# Patient Record
Sex: Female | Born: 1950
Health system: Southern US, Community
[De-identification: ages and names within clinical notes are randomized; demographics above are authoritative.]

## PROBLEM LIST (undated history)

## (undated) DIAGNOSIS — K219 Gastro-esophageal reflux disease without esophagitis: Secondary | ICD-10-CM

## (undated) DIAGNOSIS — I10 Essential (primary) hypertension: Secondary | ICD-10-CM

## (undated) DIAGNOSIS — E559 Vitamin D deficiency, unspecified: Secondary | ICD-10-CM

## (undated) DIAGNOSIS — M2550 Pain in unspecified joint: Secondary | ICD-10-CM

## (undated) DIAGNOSIS — F419 Anxiety disorder, unspecified: Secondary | ICD-10-CM

## (undated) HISTORY — DX: Anxiety disorder, unspecified: F41.9

## (undated) HISTORY — DX: Gastro-esophageal reflux disease without esophagitis: K21.9

## (undated) HISTORY — DX: Essential (primary) hypertension: I10

## (undated) HISTORY — PX: GALLBLADDER SURGERY: SHX652

## (undated) HISTORY — DX: Pain in unspecified joint: M25.50

## (undated) HISTORY — DX: Vitamin D deficiency, unspecified: E55.9

---

## 2007-07-26 ENCOUNTER — Ambulatory Visit: Payer: Self-pay | Admitting: Unknown Physician Specialty

## 2007-10-26 ENCOUNTER — Ambulatory Visit: Payer: Self-pay | Admitting: Internal Medicine

## 2008-10-29 ENCOUNTER — Ambulatory Visit: Payer: Self-pay | Admitting: Internal Medicine

## 2010-07-17 ENCOUNTER — Ambulatory Visit: Payer: Self-pay | Admitting: Otolaryngology

## 2011-03-09 ENCOUNTER — Emergency Department: Payer: Self-pay | Admitting: *Deleted

## 2011-09-28 ENCOUNTER — Emergency Department: Payer: Self-pay | Admitting: *Deleted

## 2011-09-28 LAB — URINALYSIS, COMPLETE
Bacteria: NONE SEEN
Bilirubin,UR: NEGATIVE
Glucose,UR: NEGATIVE mg/dL (ref 0–75)
Ketone: NEGATIVE
Nitrite: NEGATIVE
Ph: 5 (ref 4.5–8.0)
Protein: NEGATIVE
RBC,UR: 3 /HPF (ref 0–5)
Specific Gravity: 1.017 (ref 1.003–1.030)
Squamous Epithelial: 1
WBC UR: 4 /HPF (ref 0–5)

## 2011-09-28 LAB — LIPASE, BLOOD: Lipase: 149 U/L (ref 73–393)

## 2011-09-28 LAB — COMPREHENSIVE METABOLIC PANEL
Albumin: 3.8 g/dL (ref 3.4–5.0)
Alkaline Phosphatase: 82 U/L (ref 50–136)
Anion Gap: 11 (ref 7–16)
BUN: 20 mg/dL — ABNORMAL HIGH (ref 7–18)
Bilirubin,Total: 0.2 mg/dL (ref 0.2–1.0)
Calcium, Total: 8.8 mg/dL (ref 8.5–10.1)
Chloride: 106 mmol/L (ref 98–107)
Co2: 27 mmol/L (ref 21–32)
Creatinine: 0.87 mg/dL (ref 0.60–1.30)
EGFR (African American): >60
EGFR (Non-African Amer.): >60
Glucose: 117 mg/dL — ABNORMAL HIGH (ref 65–99)
Osmolality: 290 (ref 275–301)
Potassium: 3.4 mmol/L — ABNORMAL LOW (ref 3.5–5.1)
SGOT(AST): 19 U/L (ref 15–37)
SGPT (ALT): 18 U/L
Sodium: 144 mmol/L (ref 136–145)
Total Protein: 7.2 g/dL (ref 6.4–8.2)

## 2011-09-28 LAB — CBC
HCT: 40.2 % (ref 35.0–47.0)
HGB: 13.7 g/dL (ref 12.0–16.0)
MCH: 29 pg (ref 26.0–34.0)
MCHC: 34 g/dL (ref 32.0–36.0)
MCV: 85 fL (ref 80–100)
Platelet: 406 10*3/uL (ref 150–440)
RBC: 4.72 10*6/uL (ref 3.80–5.20)
RDW: 13.8 % (ref 11.5–14.5)
WBC: 12.3 10*3/uL — ABNORMAL HIGH (ref 3.6–11.0)

## 2011-09-28 LAB — TROPONIN I: Troponin-I: <0.02 ng/mL

## 2012-10-09 ENCOUNTER — Ambulatory Visit: Payer: Self-pay

## 2013-05-02 IMAGING — CR DG FOOT COMPLETE 3+V*L*
1 series · 3 of 3 positions shown · non-contrast
Comparison: none

REASON FOR EXAM: pain and swelling
COMMENTS:

PROCEDURE:     DXR - DXR FOOT LT COMP W/OBLIQUES  - March 09, 2011  [DATE]
RESULT:     Three views of the foot were obtained. There is a minimally
displaced fracture at the base of the fifth metatarsal. No other fractures
are seen. In the lateral view, there is observed a plantar calcaneal spur.

[Series 1: view not recorded · 0.17mm/px · 3 of 3 slices shown]
[im 1/3]
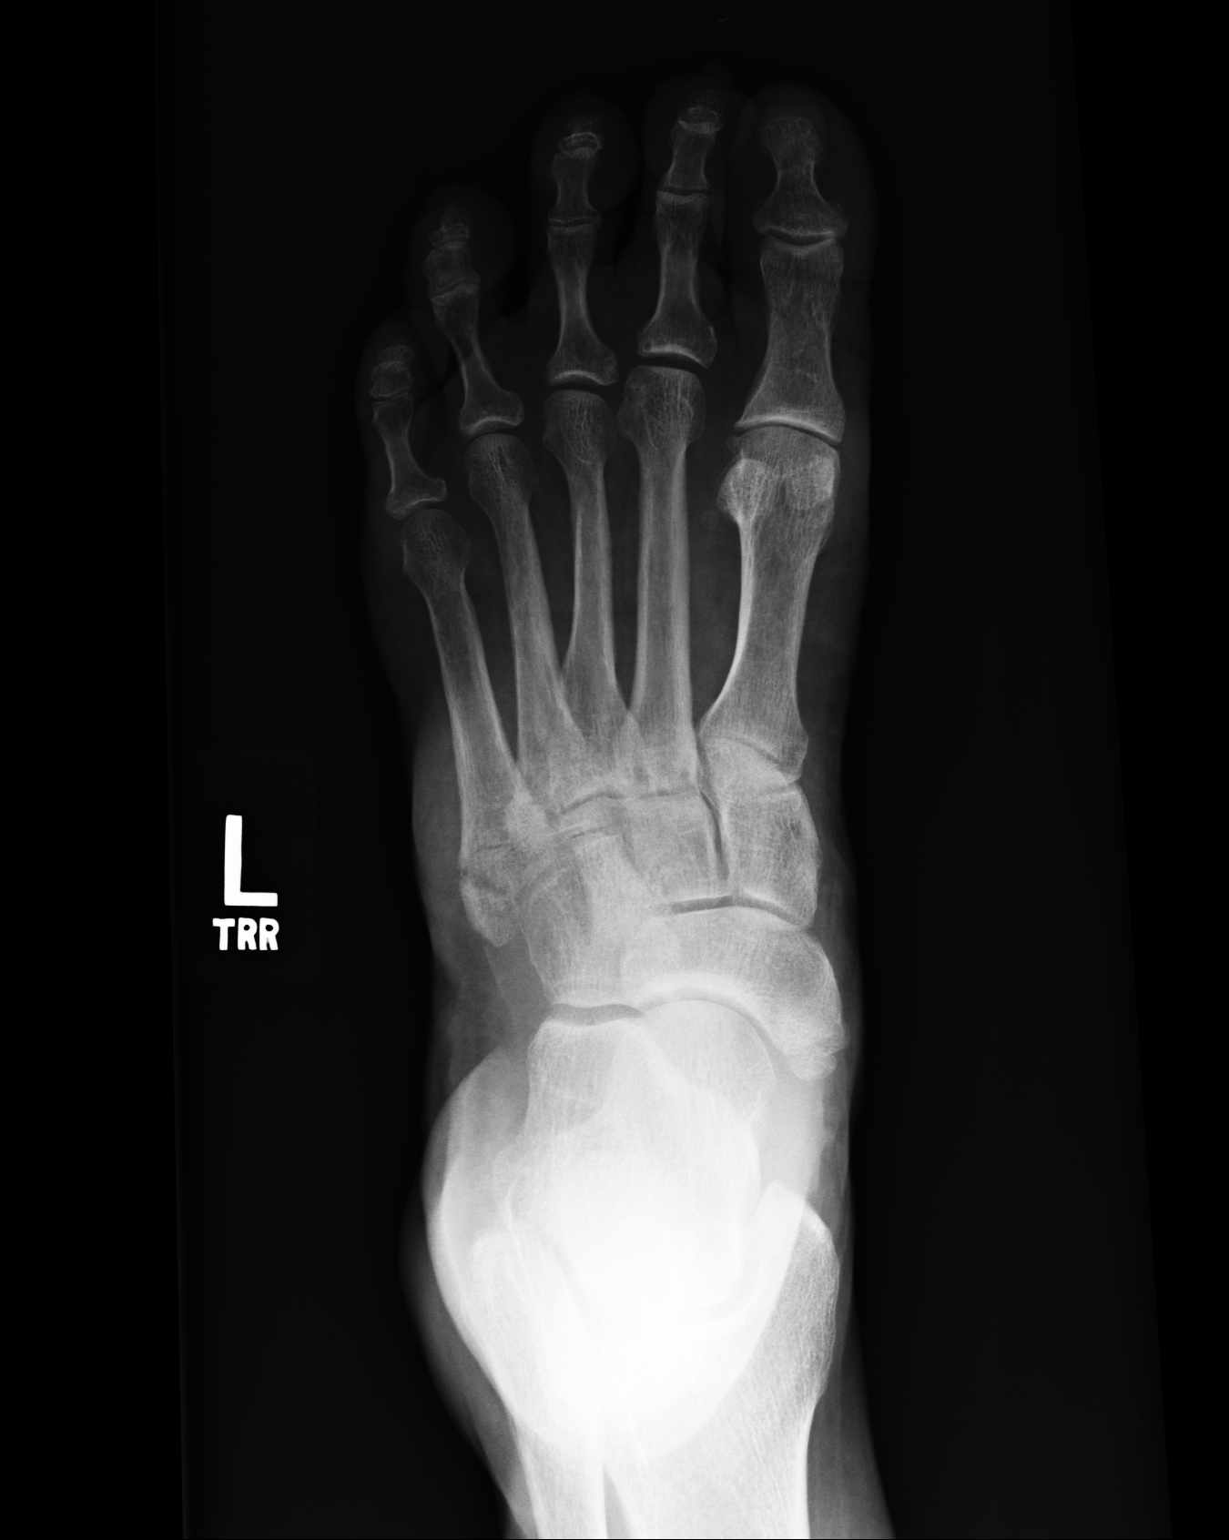
[im 2/3]
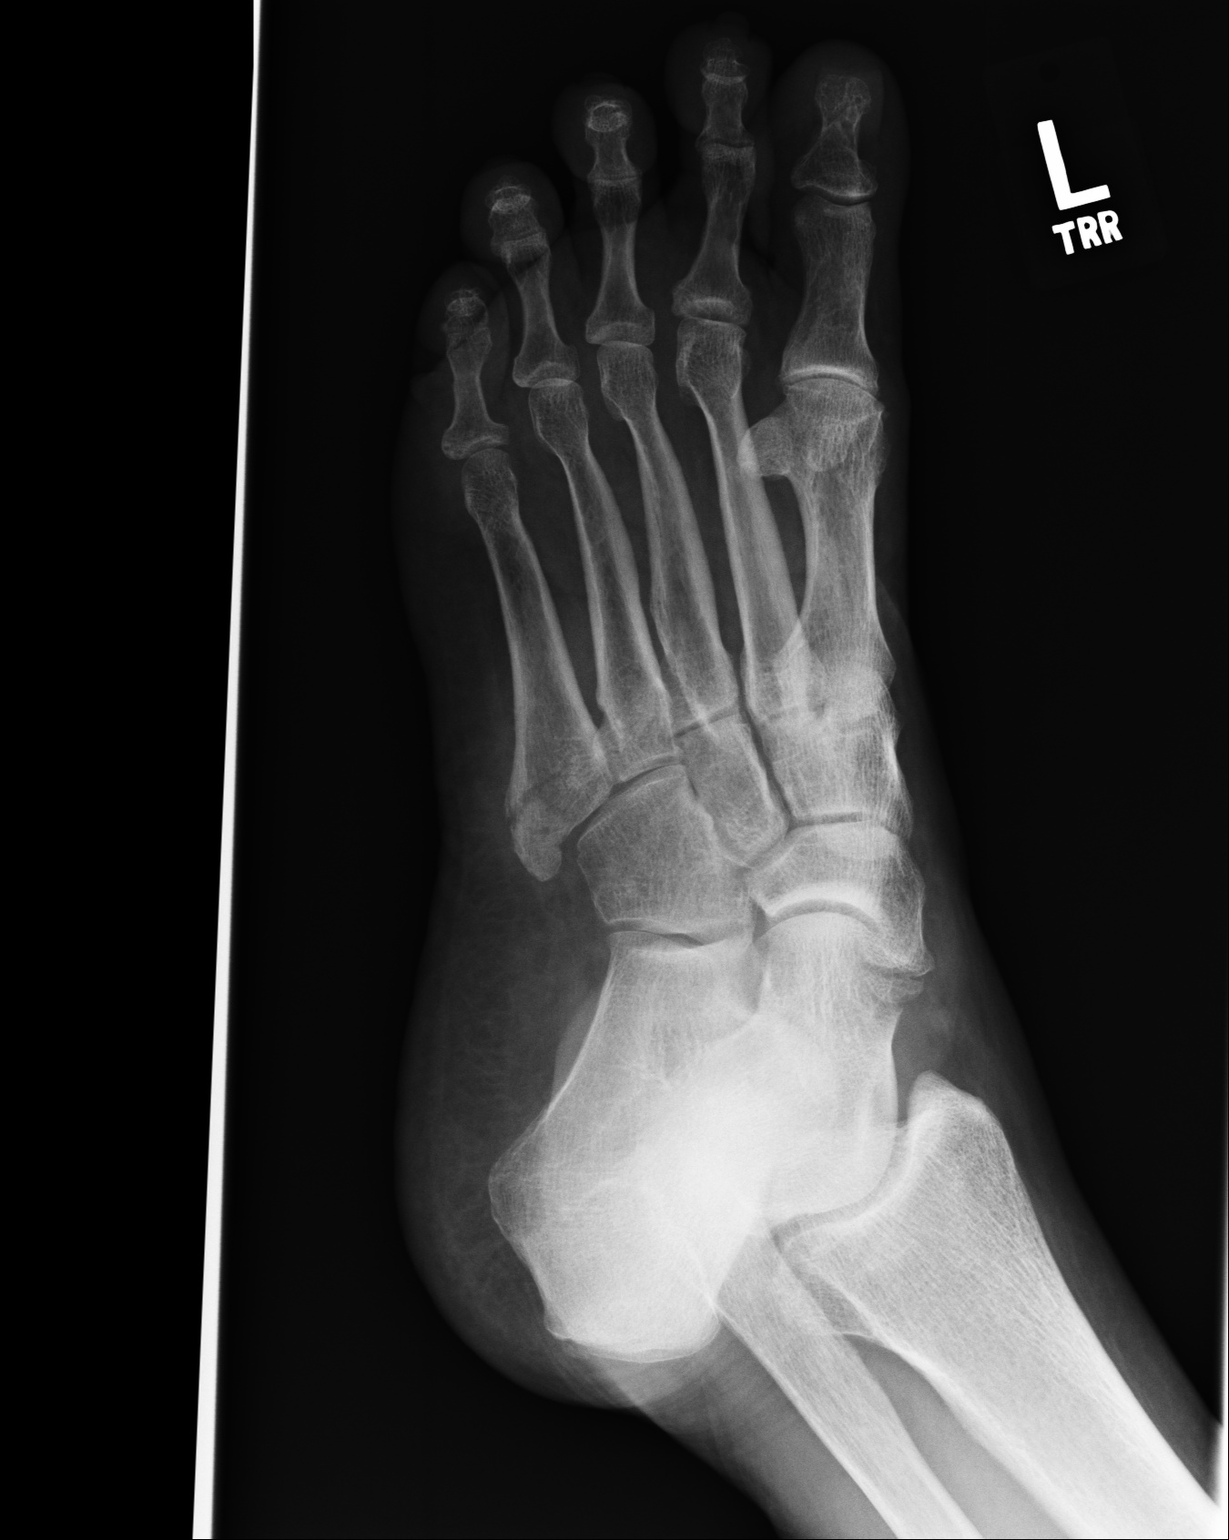
[im 3/3]
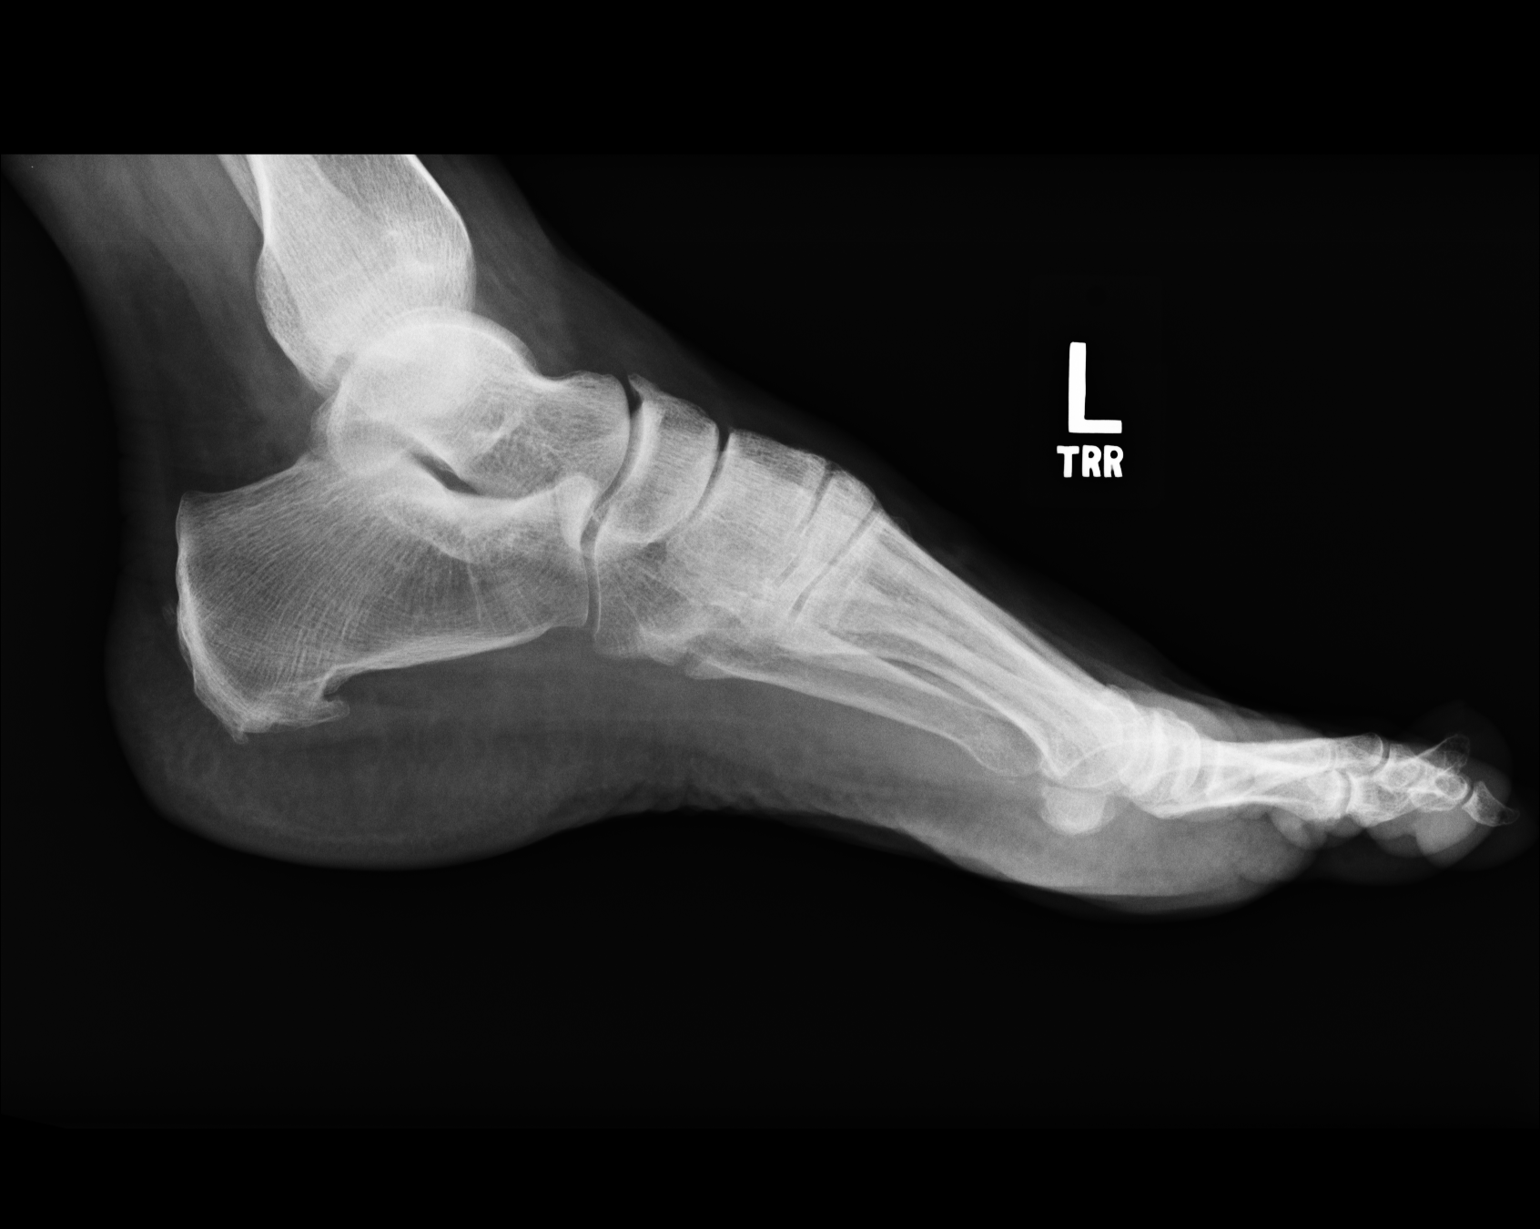

[3 of 3 positions shown; findings below may reference images not displayed]

IMPRESSION: 1. Fracture at the base of the fifth metatarsal, minimally displaced.
2. A plantar calcaneal spur is noted.

## 2013-05-02 IMAGING — CR DG ANKLE COMPLETE 3+V*L*
1 series · 5 of 5 positions shown · non-contrast
Comparison: none

REASON FOR EXAM: pain and swelling
COMMENTS:

PROCEDURE:     DXR - DXR ANKLE LEFT COMPLETE  - March 09, 2011  [DATE]
RESULT:     No fracture or dislocation about the ankle joint is seen.
Incidental note is made of a plantar calcaneal spur. In the AP view, there
is noted a fracture at the base of the fifth metatarsal.

[Series 1: view not recorded · 0.17mm/px · 5 of 5 slices shown]
[im 1/5]
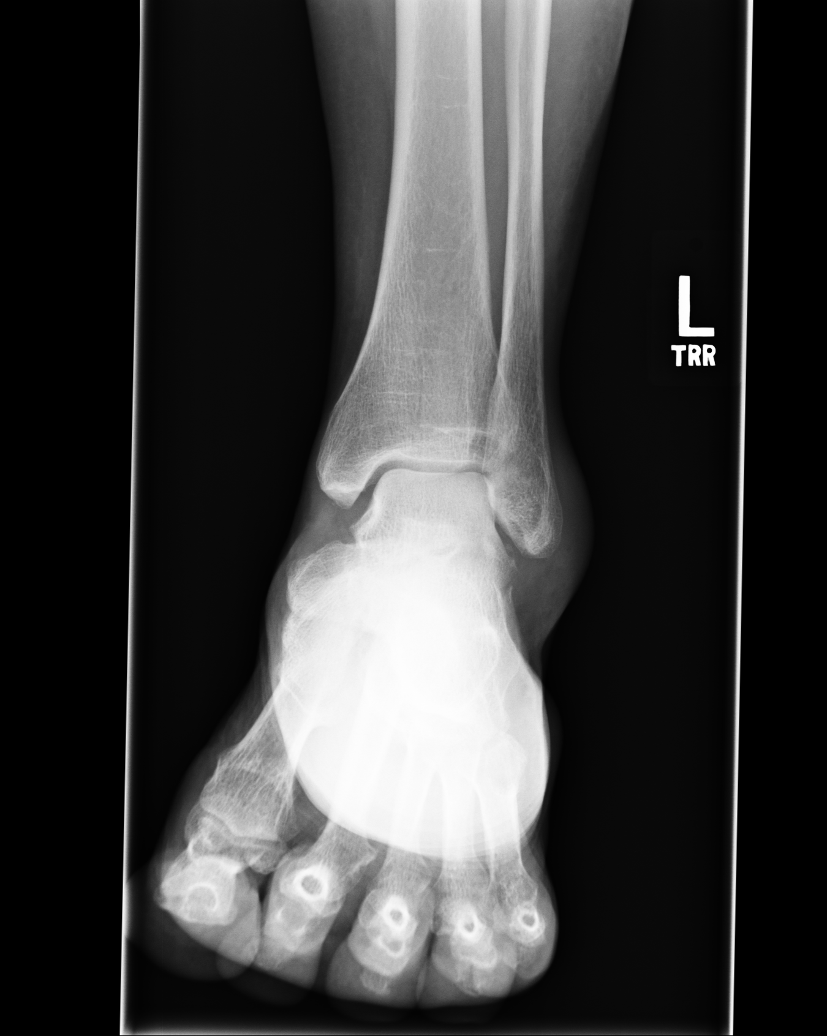
[im 2/5]
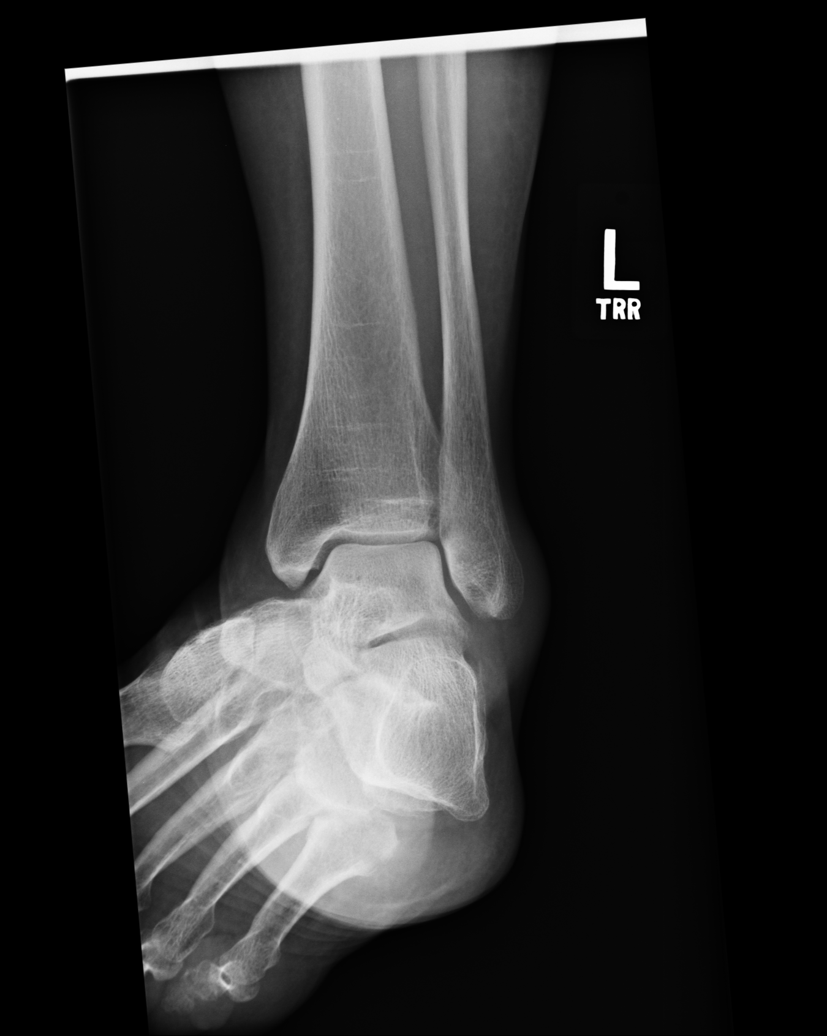
[im 3/5]
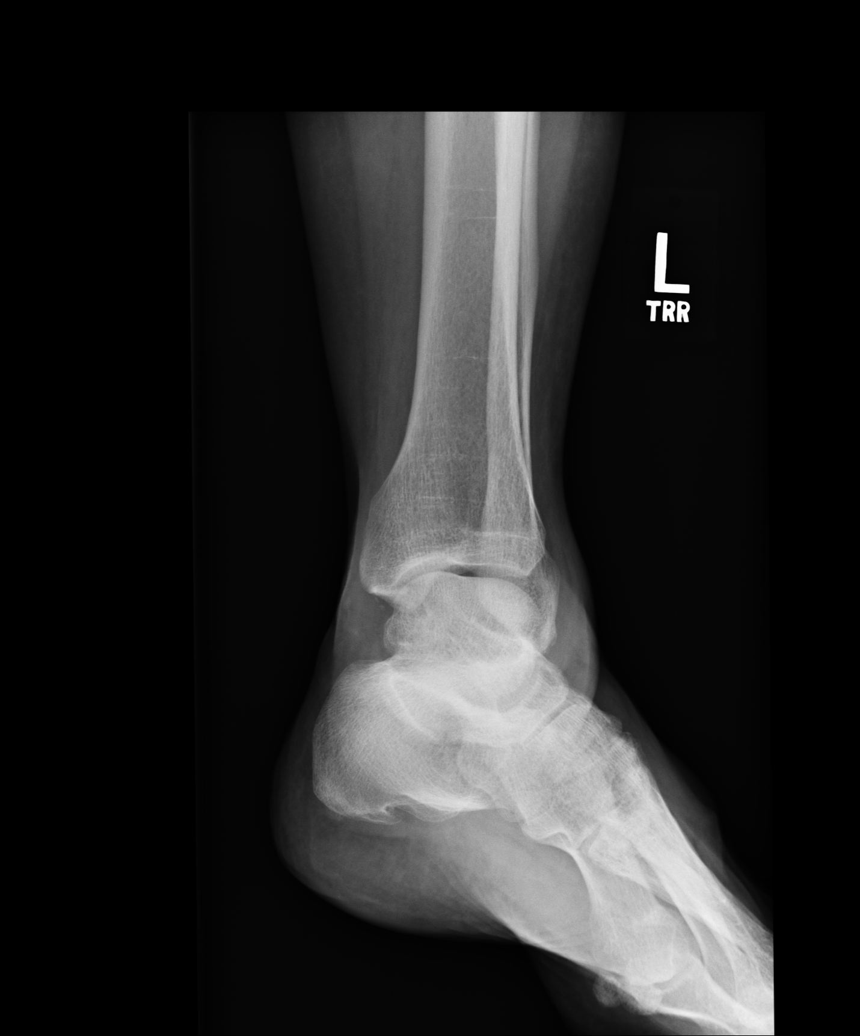
[im 4/5]
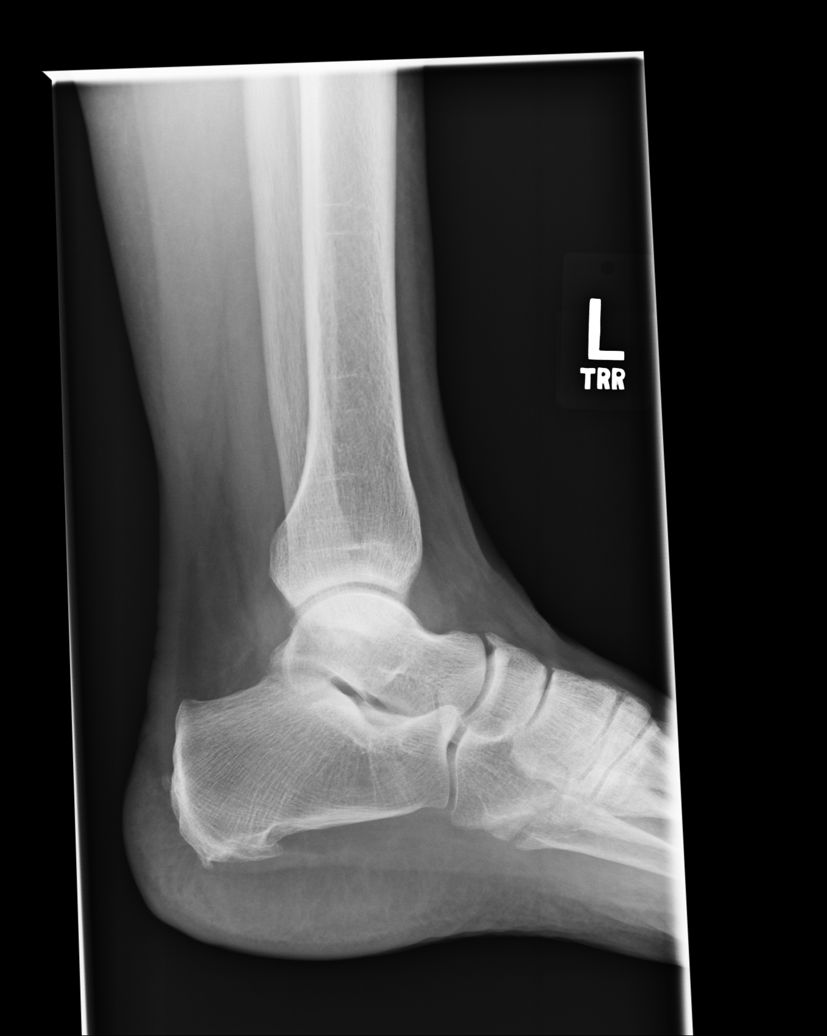
[im 5/5]
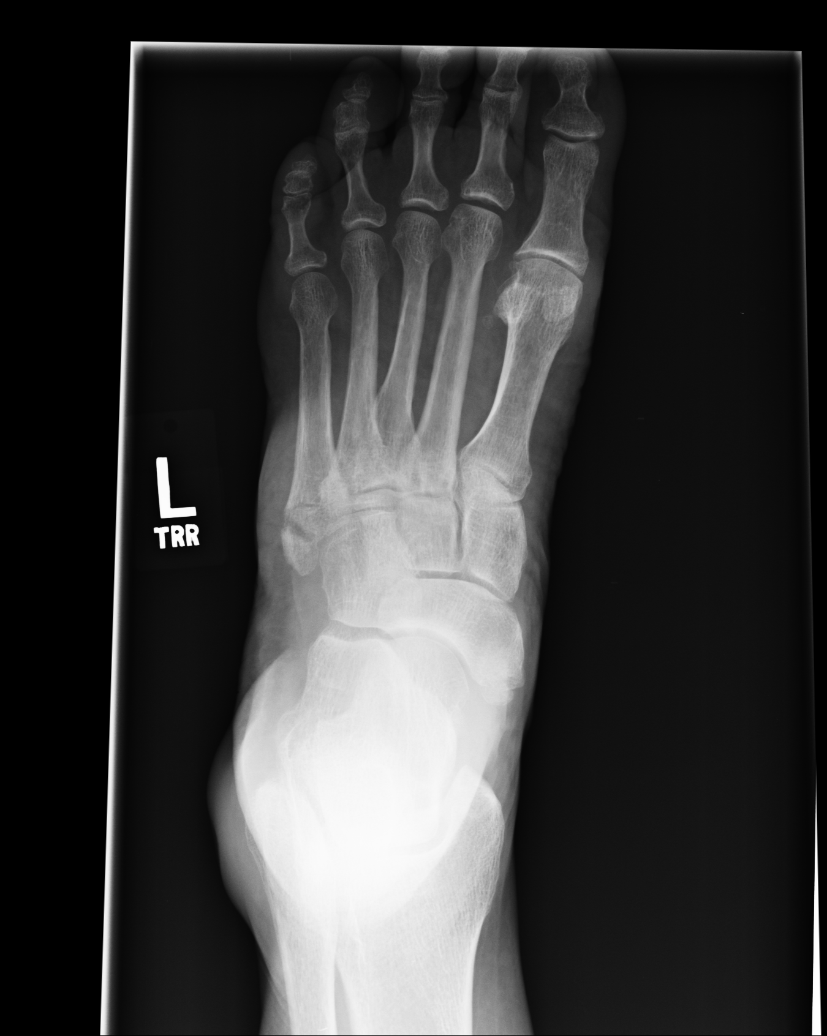

[5 of 5 positions shown; findings below may reference images not displayed]

IMPRESSION: 1. No acute bony abnormalities about the ankle are seen.
2. There is observed soft tissue swelling at the ankle laterally.
3. There is a fracture at the base of the fifth metatarsal, minimally
displaced.
4. A plantar calcaneal spur is noted.

## 2017-07-28 DIAGNOSIS — Z8371 Family history of colonic polyps: Secondary | ICD-10-CM | POA: Insufficient documentation

## 2017-07-28 DIAGNOSIS — Z83719 Family history of colon polyps, unspecified: Secondary | ICD-10-CM | POA: Insufficient documentation

## 2017-11-04 ENCOUNTER — Inpatient Hospital Stay: Payer: Medicare Other | Attending: Hematology and Oncology | Admitting: Hematology and Oncology

## 2017-11-04 ENCOUNTER — Encounter: Payer: Self-pay | Admitting: Hematology and Oncology

## 2017-11-04 ENCOUNTER — Inpatient Hospital Stay: Payer: Medicare Other

## 2017-11-04 ENCOUNTER — Encounter (INDEPENDENT_AMBULATORY_CARE_PROVIDER_SITE_OTHER): Payer: Self-pay

## 2017-11-04 VITALS — BP 137/76 | HR 74 | Temp 97.9°F | Resp 18 | Ht 69.0 in | Wt 180.1 lb

## 2017-11-04 DIAGNOSIS — Z800 Family history of malignant neoplasm of digestive organs: Secondary | ICD-10-CM | POA: Insufficient documentation

## 2017-11-04 DIAGNOSIS — D473 Essential (hemorrhagic) thrombocythemia: Secondary | ICD-10-CM | POA: Insufficient documentation

## 2017-11-04 DIAGNOSIS — K5730 Diverticulosis of large intestine without perforation or abscess without bleeding: Secondary | ICD-10-CM | POA: Diagnosis not present

## 2017-11-04 DIAGNOSIS — R4702 Dysphasia: Secondary | ICD-10-CM | POA: Diagnosis not present

## 2017-11-04 DIAGNOSIS — K317 Polyp of stomach and duodenum: Secondary | ICD-10-CM | POA: Insufficient documentation

## 2017-11-04 DIAGNOSIS — K210 Gastro-esophageal reflux disease with esophagitis: Secondary | ICD-10-CM | POA: Insufficient documentation

## 2017-11-04 DIAGNOSIS — D75839 Thrombocytosis, unspecified: Secondary | ICD-10-CM

## 2017-11-04 DIAGNOSIS — R5382 Chronic fatigue, unspecified: Secondary | ICD-10-CM | POA: Insufficient documentation

## 2017-11-04 LAB — CBC WITH DIFFERENTIAL/PLATELET
Basophils Absolute: 0.1 10*3/uL (ref 0–0.1)
Basophils Relative: 1 %
Eosinophils Absolute: 0.3 10*3/uL (ref 0–0.7)
Eosinophils Relative: 3 %
HCT: 41.5 % (ref 35.0–47.0)
Hemoglobin: 14.2 g/dL (ref 12.0–16.0)
Lymphocytes Relative: 24 %
Lymphs Abs: 2.3 10*3/uL (ref 1.0–3.6)
MCH: 29.7 pg (ref 26.0–34.0)
MCHC: 34.2 g/dL (ref 32.0–36.0)
MCV: 87 fL (ref 80.0–100.0)
Monocytes Absolute: 0.7 10*3/uL (ref 0.2–0.9)
Monocytes Relative: 7 %
Neutro Abs: 6.5 10*3/uL (ref 1.4–6.5)
Neutrophils Relative %: 65 %
Platelets: 573 10*3/uL — ABNORMAL HIGH (ref 150–440)
RBC: 4.77 MIL/uL (ref 3.80–5.20)
RDW: 14.2 % (ref 11.5–14.5)
WBC: 9.8 10*3/uL (ref 3.6–11.0)

## 2017-11-04 LAB — SEDIMENTATION RATE: Sed Rate: 11 mm/hr (ref 0–30)

## 2017-11-04 LAB — FERRITIN: Ferritin: 31 ng/mL (ref 11–307)

## 2017-11-04 LAB — IRON AND TIBC
Iron: 72 ug/dL (ref 28–170)
Saturation Ratios: 25 % (ref 10.4–31.8)
TIBC: 293 ug/dL (ref 250–450)
UIBC: 221 ug/dL

## 2017-11-04 NOTE — Patient Instructions (Signed)
Hydroxyurea capsules What is this medicine? HYDROXYUREA (hye drox ee yoor EE a) is a chemotherapy drug. This medicine is used to treat certain types of leukemias and head and neck cancer. It is also used to control the painful crises of sickle cell anemia. This medicine may be used for other purposes; ask your health care provider or pharmacist if you have questions. COMMON BRAND NAME(S): Droxia, Hydrea What should I tell my health care provider before I take this medicine? They need to know if you have any of these conditions: -gout or high levels of uric acid in the blood -HIV or AIDS -kidney disease or on hemodialysis -leg wounds or ulcers -low blood counts, like low white cell, platelet, or red cell counts -prior or current interferon therapy -recent or ongoing radiation therapy -scheduled to receive a vaccine -an unusual or allergic reaction to hydroxyurea, other medicines, foods, dyes, or preservatives -pregnant or trying to get pregnant -breast-feeding How should I use this medicine? Take this medicine by mouth with a glass of water. Follow the directions on the prescription label. Take your medicine at regular intervals. Do not take it more often than directed. Do not stop taking except on your doctor's advice. People who are not taking this medicine should not be exposed to it. Wash your hands before and after handling your bottle or medicine. Caregivers should wear disposable gloves if they must touch the bottle or medicine. Clean up any medicine powder that spills with a damp disposable towel and throw the towel away in a closed container, such as a plastic bag. A special MedGuide will be given to you by the pharmacist with each prescription and refill of Droxyia. Be sure to read this information carefully each time. Talk to your pediatrician regarding the use of this medicine in children. Special care may be needed. Overdosage: If you think you have taken too much of this medicine  contact a poison control center or emergency room at once. NOTE: This medicine is only for you. Do not share this medicine with others. What if I miss a dose? If you miss a dose, take it as soon as you can. If it is almost time for your next dose, take only that dose. Do not take double or extra doses. What may interact with this medicine? This medicine may also interact with the following medications: -didanosine -stavudine -live virus vaccines This list may not describe all possible interactions. Give your health care provider a list of all the medicines, herbs, non-prescription drugs, or dietary supplements you use. Also tell them if you smoke, drink alcohol, or use illegal drugs. Some items may interact with your medicine. What should I watch for while using this medicine? This drug may make you feel generally unwell. This is not uncommon, as chemotherapy can affect healthy cells as well as cancer cells. Report any side effects. Continue your course of treatment even though you feel ill unless your doctor tells you to stop. You will receive regular blood tests during your treatment. Call your doctor or health care professional for advice if you get a fever, chills or sore throat, or other symptoms of a cold or flu. Do not treat yourself. This drug decreases your body's ability to fight infections. Try to avoid being around people who are sick. This medicine may increase your risk to bruise or bleed. Call your doctor or health care professional if you notice any unusual bleeding. Talk to your doctor about your risk of cancer. You may  be more at risk for certain types of cancers if you take this medicine. Keep out of the sun. If you cannot avoid being in the sun, wear protective clothing and use sunscreen. Do not use sun lamps or tanning beds/booths. Do not become pregnant while taking this medicine or for at least 6 months after stopping it. Women should inform their doctor if they wish to become  pregnant or think they might be pregnant. Men should not father a child while taking this medicine and for at least a year after stopping it. There is a potential for serious side effects to an unborn child. Talk to your health care professional or pharmacist for more information. Do not breast-feed an infant while taking this medicine. This may interfere with the ability to have or father a child. You should talk with your doctor or health care professional if you are concerned about your fertility. What side effects may I notice from receiving this medicine? Side effects that you should report to your doctor or health care professional as soon as possible: -allergic reactions like skin rash, itching or hives, swelling of the face, lips, or tongue -breathing problems -burning, redness or pain at the site of any radiation therapy -low blood counts - this medicine may decrease the number of white blood cells, red blood cells and platelets. You may be at increased risk for infections and bleeding. -signs of decreased platelets or bleeding - bruising, pinpoint red spots on the skin, Suchy, tarry stools, blood in the urine -signs of decreased red blood cells - unusually weak or tired, fainting spells, lightheadedness -signs of infection - fever or chills, cough, sore throat, pain or difficulty passing urine -signs and symptoms of bleeding such as bloody or Ikard, tarry stools; red or dark-brown urine; spitting up blood or brown material that looks like coffee grounds; red spots on the skin; unusual bruising or bleeding from the eye, gums, or nose -skin ulcers Side effects that usually do not require medical attention (report to your doctor or health care professional if they continue or are bothersome): -constipation -diarrhea -loss of appetite -mouth sores -nausea This list may not describe all possible side effects. Call your doctor for medical advice about side effects. You may report side effects  to FDA at 1-800-FDA-1088. Where should I keep my medicine? Keep out of the reach of children. See product for storage instructions. Each product may have different instructions. Keep tightly closed. Throw away any unused medicine after the expiration date. NOTE: This sheet is a summary. It may not cover all possible information. If you have questions about this medicine, talk to your doctor, pharmacist, or health care provider.  2018 [employer]vier/Gold Standard (2016-05-13 11:43:13)

## 2017-11-04 NOTE — Progress Notes (Signed)
[facility] day:  11/04/2017  Chief Complaint: Emily Wallace is a 67 y.o. female with thrombocytosis who is referred in consultation by Dr. Vira Wallace for assessment and management.  HPI: Patient presents for evaluation of elevated platelet count that was discovered about 6 months ago. Patient notes that once discovered, Dr. Kary Wallace advised patient to start on a baby aspirin daily.   Platelet count has trended up over the course of the last 4 years: 12/24/2013 - 457,000 07/24/2014 - 497,000 01/21/2015 - 549,000 01/28/2016 - 539,000 02/01/2017 - 551,000 05/05/2017 - 590,000  CBC on 05/05/2017 revealed a hematocrit of 38.5, hemoglobin 13, MCV 86.7, platelets 590,000, white count 10,800 and ANC of 7600.  Differential included 70% segs, 16% lymphocytes and 14% mixed.  Creatinine was 0.7.  Liver function tests were normal.  CBC on 09/28/2011 revealed a hematocrit of 40.2, hemoglobin 13.7, MCV 85, platelets 406,000, and white count 12,300.    EGD on 07/25/2017 for epigastric pain and dysphasia revealed reflux esophagitis, a single gastric polyp (fundic gland polyp) normal duodenum.  H. pylori and Barrett's were negative.  Colonoscopy on 09/22/2017 revealed one small polyp in the mid transverse colon (tubular adenoma) and diverticulosis in the sigmoid colon.  Patient has intentionally lost 60 pounds over a 2 year period. She works out 5 days a week, and is eating "clean". Patient eats a lot of fresh fruits and vegetables. She eats meat on a daily basis. She denies ice pica. She has "twitcy legs" that she attributes to exercise. She drinks camomile tea with tumeric nights for her leg symptoms.   She states that her twin brother had stage IV colon cancer.  Her paternal aunt had colon cancer.  Her father had a "tumor in the SVC" and died at age 26.  Her mother had rheumatic fever s/p valve replacement and CVA.   Past Medical History:  Diagnosis Date  .  Anxiety   . GERD (gastroesophageal reflux disease)   . Hypertension   . Joint pain   . Vitamin D deficiency     Past Surgical History:  Procedure Laterality Date  . GALLBLADDER SURGERY      Family History  Problem Relation Age of Onset  . Colon cancer Brother   . Colon cancer Maternal Aunt   . Breast cancer Maternal Aunt   . Colon cancer Paternal Aunt   . Diabetes Maternal Grandmother   . Colon cancer Paternal Grandmother     Social History:  reports that she has never smoked. She has never used smokeless tobacco. Her alcohol and drug histories are not on file.  Patient smoked "3 cigarettes" the whole time while she was in college. She has rarely drinks alcohol. Patient is a retired Visual merchandiser. Patient denies known exposures to radiation on toxins. She lives in [place].  The patient is alone today.  Allergies:  Allergies  Allergen Reactions  . Codone [Hydrocodone] Rash    Current Medications: Current Outpatient Medications  Medication Sig Dispense Refill  . ACAI PO Take by mouth daily. 1000 mg daily    . aspirin EC 81 MG tablet Take 81 mg by mouth daily.    Marland Kitchen esomeprazole (NEXIUM) 40 MG capsule Take 40 mg by mouth daily at 12 noon.    Marland Kitchen KRILL OIL PO Take by mouth daily.    Marland Kitchen losartan (COZAAR) 50 MG tablet Take 50 mg by mouth daily.    . Multiple Minerals-Vitamins (CALCIUM-MAGNESIUM-ZINC-D3 PO) Take by  mouth 2 (two) times daily.    . ranitidine (ZANTAC) 150 MG capsule Take 150 mg by mouth daily.    . sertraline (ZOLOFT) 100 MG tablet Take 100 mg by mouth daily.    . TURMERIC PO Take by mouth daily. 1500 mg daily    . VITAMIN D, ERGOCALCIFEROL, PO Take 5,000 Units by mouth daily.     No current facility-administered medications for this visit.     Review of Systems  Constitutional: Positive for weight loss (intentional - 60 pound weight loss over course of 2 years. ). Negative for diaphoresis, fever and malaise/fatigue.       Active. "I feel good".    HENT: Negative.  Negative for congestion, nosebleeds, sinus pain and sore throat.   Eyes: Negative for blurred vision, double vision, pain, discharge and redness.  Respiratory: Negative.  Negative for cough, hemoptysis, sputum production and shortness of breath.   Cardiovascular: Negative.  Negative for chest pain, palpitations, orthopnea, leg swelling and PND.  Gastrointestinal: Positive for heartburn (on PPI). Negative for abdominal pain, blood in stool, constipation, diarrhea, melena, nausea and vomiting.  Genitourinary: Negative for dysuria, frequency, hematuria and urgency.  Musculoskeletal: Positive for joint pain (with strenuous exercise). Negative for back pain, falls and myalgias.  Skin: Negative for itching and rash.  Neurological: Positive for headaches (chronic - sinus related). Negative for dizziness, tremors and weakness.       "Twitchy legs" attributed to exercise.  Endo/Heme/Allergies: Does not bruise/bleed easily.  Psychiatric/Behavioral: Negative for depression, memory loss and suicidal ideas. The patient is nervous/anxious (on xanax). The patient does not have insomnia.   All other systems reviewed and are negative.  Physical Exam: Blood pressure 137/76, pulse 74, temperature 97.9 F (36.6 C), temperature source Tympanic, resp. rate 18, height 5\' 9"  (1.753 m), weight 180 lb 1.6 oz (81.7 kg), SpO2 100 %. GENERAL:  Well developed, well nourished, woman sitting comfortably in the exam room in no acute distress. MENTAL STATUS:  Alert and oriented to person, place and time. HEAD:  Short brown hair.  Normocephalic, atraumatic, face symmetric, no Cushingoid features. EYES:  Blue eyes.  Pupils equal round and reactive to light and accomodation.  No conjunctivitis or scleral icterus. ENT:  Oropharynx clear without lesion.  Tongue normal. Mucous membranes moist.  RESPIRATORY:  Clear to auscultation without rales, wheezes or rhonchi. CARDIOVASCULAR:  Regular rate and rhythm without  murmur, rub or gallop. ABDOMEN:  Soft, non-tender, with active bowel sounds, and no hepatosplenomegaly.  No masses. SKIN:  No rashes, ulcers or lesions. EXTREMITIES: No edema, no skin discoloration or tenderness.  No palpable cords. LYMPH NODES: No palpable cervical, supraclavicular, axillary or inguinal adenopathy  NEUROLOGICAL: Unremarkable. PSYCH:  Appropriate.   No visits with results within 3 Day(s) from this visit.  Latest known visit with results is:  No results found for any previous visit.    Assessment:  BLAKE GOYA is a 67 y.o. 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  Platelet count has ranged between 457,000 - 590,000.  CBC on 05/05/2017 revealed a hematocrit of 38.5, hemoglobin 13, MCV 86.7, platelets 590,000, white count 10,800 and ANC of 7600.  Differential was unremarkable.  EGD on 07/25/2017 for epigastric pain and dysphasia revealed reflux esophagitis, a single gastric polyp (fundic gland polyp) normal duodenum.  H. pylori and Barrett's were negative.  Colonoscopy on 09/22/2017 revealed one small polyp in the mid transverse colon (tubular adenoma) and diverticulosis in the sigmoid colon.  She has a  family history of colon cancer (twin brother and paternal aunt).  Symptomatically, she is doing well.  Exam reveals no adenopathy or hepatosplenomegaly.  Plan: 1. Discuss thrombocytosis. Discuss differential diagnosis including inflammation, iron deficiency, and an underlying myeloproliferative disorder (MPD).   Reviewed with patient that if MPD diagnosis confirmed, we will need to proceed with bone marrow aspirate and biopsy.   Discuss possible diagnosis of essential thrombocytosis (ET) and treatment with hydroxyurea. Side effects briefly reviewed as patient has not been diagnosed with ET, but only suspected.  Patient provided with printed information today on her AVS for review at home per her request. Patient encouraged to make a list of questions and/or  concerns for further discussion.  2. Labs today:  CBC with diff, ferritin, iron studies, sed rate, JAK-2 with reflex to exon 12-15, CALR, and MPL. 3. RTC in 2 weeks for MD assessment, review of workup, and discussion regarding direction of therapy.    Honor Loh, NP  11/04/2017, 12:23 PM   I saw and evaluated the patient, participating in the key portions of the service and reviewing pertinent diagnostic studies and records.  I reviewed the nurse practitioner's note and agree with the findings and the plan.  The assessment and plan were discussed with the patient.  Multiple questions were asked by the patient and answered.   Nolon Stalls, MD 11/04/2017, 12:23 PM

## 2017-11-09 LAB — JAK2 V617F, W REFLEX TO CALR/E12/MPL

## 2017-11-17 LAB — BCR-ABL1 FISH
Cells Analyzed: 200
Cells Counted: 200

## 2017-11-18 ENCOUNTER — Inpatient Hospital Stay (HOSPITAL_BASED_OUTPATIENT_CLINIC_OR_DEPARTMENT_OTHER): Payer: Medicare Other | Admitting: Hematology and Oncology

## 2017-11-18 ENCOUNTER — Encounter: Payer: Self-pay | Admitting: Hematology and Oncology

## 2017-11-18 ENCOUNTER — Other Ambulatory Visit: Payer: Self-pay

## 2017-11-18 VITALS — BP 103/68 | HR 82 | Temp 98.0°F | Resp 18 | Wt 177.5 lb

## 2017-11-18 DIAGNOSIS — R5382 Chronic fatigue, unspecified: Secondary | ICD-10-CM

## 2017-11-18 DIAGNOSIS — K317 Polyp of stomach and duodenum: Secondary | ICD-10-CM

## 2017-11-18 DIAGNOSIS — R4702 Dysphasia: Secondary | ICD-10-CM | POA: Diagnosis not present

## 2017-11-18 DIAGNOSIS — Z800 Family history of malignant neoplasm of digestive organs: Secondary | ICD-10-CM

## 2017-11-18 DIAGNOSIS — D473 Essential (hemorrhagic) thrombocythemia: Secondary | ICD-10-CM | POA: Diagnosis not present

## 2017-11-18 DIAGNOSIS — K5730 Diverticulosis of large intestine without perforation or abscess without bleeding: Secondary | ICD-10-CM

## 2017-11-18 DIAGNOSIS — Z7189 Other specified counseling: Secondary | ICD-10-CM | POA: Insufficient documentation

## 2017-11-18 DIAGNOSIS — K210 Gastro-esophageal reflux disease with esophagitis: Secondary | ICD-10-CM

## 2017-11-18 NOTE — Progress Notes (Signed)
Patient here for follow up. No concerns voiced.  °

## 2017-11-18 NOTE — Patient Instructions (Signed)
Essential Thrombocythemia Essential thrombocythemia is a condition in which a person has too many platelets (thrombocytes) in the blood. Platelets are parts of blood that stick together and form a clot (thrombus) to help the body stop bleeding after an injury. This condition may also be called primary or essential thrombocytosis. Essential thrombocythemia happens when abnormal cells in the bone marrow (megakaryocytes) make too many platelets. What are the causes? The cause of this condition is not known. What are the signs or symptoms? This condition may not cause any symptoms. If you have symptoms, they may include:  Weakness.  Headache.  Itching.  Sweating.  Fever.  Dizziness or confusion.  Tingling or burning in your hands or feet.  Blood clots.  Bleeding.  Enlarged spleen.  How is this diagnosed? This condition may be diagnosed based on:  A physical exam.  Your symptoms.  Your medical history.  Blood tests.  A procedure to collect a sample of your bone marrow (bone marrow aspiration) for testing.  How is this treated? If you do not have symptoms, you may not need treatment. Your health care provider may monitor your condition with regular blood tests. If you have symptoms, or if your platelet count is very high, you may be treated with:  Aspirin or other medicines to thin the blood and prevent blood clots.  Medicines to reduce the number of platelets in your blood.  A procedure to remove some platelets from your blood (plateletpheresis). During this procedure: ? Your health care provider will place an IV into one of your veins. ? The IV will be used to draw blood into a machine that separates out the extra platelets. ? The blood with reduced platelets will be returned to your body.  Follow these instructions at home:  Take over-the-counter and prescription medicines only as told by your health care provider.  If you are taking blood thinners: ? Talk with  your health care provider before you take any medicines that contain aspirin or NSAIDs. These medicines increase your risk for dangerous bleeding. ? Take your medicine exactly as told, at the same time every day. ? Avoid activities that could cause injury or bruising, and follow instructions about how to prevent falls. ? Wear a medical alert bracelet or carry a card that lists what medicines you take.  Tell all health care providers, including dentists, about any medicines you are taking to prevent blood clots.  Do not use any products that contain nicotine or tobacco, such as cigarettes and e-cigarettes. If you need help quitting, ask your health care provider.  Ask your health care provider about managing or preventing high cholesterol, high blood pressure, and diabetes. These conditions can make essential thrombocythemia worse.  Keep all follow-up visits as told by your health care provider. This is important. Contact a health care provider if:  You have severe pain, and medicines do not help.  You have problems taking your medicines to prevent blood clots.  You faint. Get help right away if:  You have bleeding or blood clots.  You have unusual bruises.  You have bloody or tarry stools.  You have pink or bloody urine.  Your menstrual periods are heavier than normal, if applicable.  You have nosebleeds and bleeding gums.  You have chest pain.  You have trouble breathing.  You have any symptoms of a stroke. "BE FAST" is an easy way to remember the main warning signs of a stroke: ? B - Balance. Signs are dizziness, sudden trouble  walking, or loss of balance. ? E - Eyes. Signs are trouble seeing or a sudden change in vision. ? F - Face. Signs are sudden weakness or numbness of the face, or the face or eyelid drooping on one side. ? A - Arm. Signs are weakness or numbness in an arm. This happens suddenly and usually on one side of the body. ? S - Speech. Signs are sudden  trouble speaking, slurred speech, or trouble understanding what people say. ? T - Time. Time to call emergency services. Write down what time symptoms started.  You have other signs of a stroke, such as: ? A sudden, severe headache with no known cause. ? Nausea or vomiting. ? Seizure. These symptoms may represent a serious problem that is an emergency. Do not wait to see if the symptoms will go away. Get medical help right away. Call your local emergency services (911 in the [place] Do not drive yourself to the hospital. Summary  Essential thrombocythemia happens when abnormal cells in the bone marrow make too many platelets.  If you have symptoms, or if your platelet count is very high, you may need treatment.  Treatment can vary and may include medicines to thin the blood and prevent blood clots.  Ask your health care provider about how to manage or prevent high cholesterol, high blood pressure, and diabetes. These conditions can make essential thrombocythemia worse.  Get help right away if you have any symptoms of stroke. This information is not intended to replace advice given to you by your health care provider. Make sure you discuss any questions you have with your health care provider. Document Released: 09/24/2016 Document Revised: 09/24/2016 Document Reviewed: 09/24/2016 Else[employer] Interactive Patient Education  2018 [employer]. Hydroxyurea capsules What is this medicine? HYDROXYUREA (hye drox ee yoor EE a) is a chemotherapy drug. This medicine is used to treat certain types of leukemias and head and neck cancer. It is also used to control the painful crises of sickle cell anemia. This medicine may be used for other purposes; ask your health care provider or pharmacist if you have questions. COMMON BRAND NAME(S): Droxia, Hydrea What should I tell my health care provider before I take this medicine? They need to know if you have any of these conditions: -gout or high levels of  uric acid in the blood -HIV or AIDS -kidney disease or on hemodialysis -leg wounds or ulcers -low blood counts, like low white cell, platelet, or red cell counts -prior or current interferon therapy -recent or ongoing radiation therapy -scheduled to receive a vaccine -an unusual or allergic reaction to hydroxyurea, other medicines, foods, dyes, or preservatives -pregnant or trying to get pregnant -breast-feeding How should I use this medicine? Take this medicine by mouth with a glass of water. Follow the directions on the prescription label. Take your medicine at regular intervals. Do not take it more often than directed. Do not stop taking except on your doctor's advice. People who are not taking this medicine should not be exposed to it. Wash your hands before and after handling your bottle or medicine. Caregivers should wear disposable gloves if they must touch the bottle or medicine. Clean up any medicine powder that spills with a damp disposable towel and throw the towel away in a closed container, such as a plastic bag. A special MedGuide will be given to you by the pharmacist with each prescription and refill of Droxyia. Be sure to read this information carefully each time. Talk to your  pediatrician regarding the use of this medicine in children. Special care may be needed. Overdosage: If you think you have taken too much of this medicine contact a poison control center or emergency room at once. NOTE: This medicine is only for you. Do not share this medicine with others. What if I miss a dose? If you miss a dose, take it as soon as you can. If it is almost time for your next dose, take only that dose. Do not take double or extra doses. What may interact with this medicine? This medicine may also interact with the following medications: -didanosine -stavudine -live virus vaccines This list may not describe all possible interactions. Give your health care provider a list of all the  medicines, herbs, non-prescription drugs, or dietary supplements you use. Also tell them if you smoke, drink alcohol, or use illegal drugs. Some items may interact with your medicine. What should I watch for while using this medicine? This drug may make you feel generally unwell. This is not uncommon, as chemotherapy can affect healthy cells as well as cancer cells. Report any side effects. Continue your course of treatment even though you feel ill unless your doctor tells you to stop. You will receive regular blood tests during your treatment. Call your doctor or health care professional for advice if you get a fever, chills or sore throat, or other symptoms of a cold or flu. Do not treat yourself. This drug decreases your body's ability to fight infections. Try to avoid being around people who are sick. This medicine may increase your risk to bruise or bleed. Call your doctor or health care professional if you notice any unusual bleeding. Talk to your doctor about your risk of cancer. You may be more at risk for certain types of cancers if you take this medicine. Keep out of the sun. If you cannot avoid being in the sun, wear protective clothing and use sunscreen. Do not use sun lamps or tanning beds/booths. Do not become pregnant while taking this medicine or for at least 6 months after stopping it. Women should inform their doctor if they wish to become pregnant or think they might be pregnant. Men should not father a child while taking this medicine and for at least a year after stopping it. There is a potential for serious side effects to an unborn child. Talk to your health care professional or pharmacist for more information. Do not breast-feed an infant while taking this medicine. This may interfere with the ability to have or father a child. You should talk with your doctor or health care professional if you are concerned about your fertility. What side effects may I notice from receiving this  medicine? Side effects that you should report to your doctor or health care professional as soon as possible: -allergic reactions like skin rash, itching or hives, swelling of the face, lips, or tongue -breathing problems -burning, redness or pain at the site of any radiation therapy -low blood counts - this medicine may decrease the number of white blood cells, red blood cells and platelets. You may be at increased risk for infections and bleeding. -signs of decreased platelets or bleeding - bruising, pinpoint red spots on the skin, [place], tarry stools, blood in the urine -signs of decreased red blood cells - unusually weak or tired, fainting spells, lightheadedness -signs of infection - fever or chills, cough, sore throat, pain or difficulty passing urine -signs and symptoms of bleeding such as bloody or [place], tarry  stools; red or dark-brown urine; spitting up blood or brown material that looks like coffee grounds; red spots on the skin; unusual bruising or bleeding from the eye, gums, or nose -skin ulcers Side effects that usually do not require medical attention (report to your doctor or health care professional if they continue or are bothersome): -constipation -diarrhea -loss of appetite -mouth sores -nausea This list may not describe all possible side effects. Call your doctor for medical advice about side effects. You may report side effects to FDA at 1-800-FDA-1088. Where should I keep my medicine? Keep out of the reach of children. See product for storage instructions. Each product may have different instructions. Keep tightly closed. Throw away any unused medicine after the expiration date. NOTE: This sheet is a summary. It may not cover all possible information. If you have questions about this medicine, talk to your doctor, pharmacist, or health care provider.  2018 Elsevier/[employer] Standard (2016-05-13 11:43:13)

## 2017-11-18 NOTE — Progress Notes (Signed)
[facility] day:  11/18/2017  Chief Complaint: Emily Wallace is a 67 y.o. female with thrombocytosis who is seen for review of work-up and discussion regarding direction of therapy.  HPI:  The patient was last seen in the hematology clinic on 11/04/2017 for initial consultation. She had thrombocytosis for the past 4 years.  Platelet count had ranged between 457,000 - 590,000.  She underwent a work-up.  CBC revealed a hematocrit of 41.5, hemoglobin 14.2, MCV 87, platelets 573,000, WBC 9800 with an ANC of 6500.  Differential was normal.  Ferritin was 31.  Iron studies included a saturation of 25% and TIBC of 293.  Sed rate was 11.  BCR-ABL was negative.  JAK2 V617F was positive.  During the interim, patient continues to be fatigued. She continues to work out with a trainer 5 days a week. She also teaches water aerobic classes as well. Her energy level is "normal for her". Patient denies any acute concerns today. Patient denies that she has experienced any B symptoms. She denies any interval infections.   Patient advises that she maintains an adequate appetite. She is eating well. Weight today is 177 lb 8 oz (80.5 kg), which compared to her last visit to the clinic, represents a  3 pound decrease.  Patient denies pain in the clinic today.   Past Medical History:  Diagnosis Date  . Anxiety   . GERD (gastroesophageal reflux disease)   . Hypertension   . Joint pain   . Vitamin D deficiency     Past Surgical History:  Procedure Laterality Date  . GALLBLADDER SURGERY      Family History  Problem Relation Age of Onset  . Colon cancer Brother   . Colon cancer Maternal Aunt   . Breast cancer Maternal Aunt   . Colon cancer Paternal Aunt   . Diabetes Maternal Grandmother   . Colon cancer Paternal Grandmother     Social History:  reports that she has never smoked. She has never used smokeless tobacco. Her alcohol and drug histories are not on  file.  Patient smoked "3 cigarettes" the whole time while she was in college. She has rarely drinks alcohol. Patient is a retired Visual merchandiser. Patient denies known exposures to radiation on toxins. She lives in [place].  The patient is accompanied by her husband, Elta Guadeloupe, today.  Allergies:  Allergies  Allergen Reactions  . Codone [Hydrocodone] Rash    Current Medications: Current Outpatient Medications  Medication Sig Dispense Refill  . ACAI PO Take by mouth daily. 1000 mg daily    . aspirin EC 81 MG tablet Take 81 mg by mouth daily.    Marland Kitchen esomeprazole (NEXIUM) 40 MG capsule Take 40 mg by mouth daily at 12 noon.    Marland Kitchen KRILL OIL PO Take by mouth daily.    Marland Kitchen losartan (COZAAR) 50 MG tablet Take 50 mg by mouth daily.    . Multiple Minerals-Vitamins (CALCIUM-MAGNESIUM-ZINC-D3 PO) Take by mouth 2 (two) times daily.    . ranitidine (ZANTAC) 150 MG capsule Take 150 mg by mouth daily.    . sertraline (ZOLOFT) 100 MG tablet Take 100 mg by mouth daily.    . TURMERIC PO Take by mouth daily. 1500 mg daily    . VITAMIN D, ERGOCALCIFEROL, PO Take 5,000 Units by mouth daily.     No current facility-administered medications for this visit.     Review of Systems  Constitutional: Positive for malaise/fatigue and  weight loss (down 3 pounds since last visit. ). Negative for diaphoresis and fever.       Feels good.  HENT: Negative.  Negative for congestion, hearing loss, nosebleeds, sinus pain, sore throat and tinnitus.   Eyes: Negative.  Negative for blurred vision, double vision, pain, discharge and redness.  Respiratory: Negative.  Negative for cough, hemoptysis, sputum production and shortness of breath.   Cardiovascular: Negative.  Negative for chest pain, palpitations, orthopnea, leg swelling and PND.  Gastrointestinal: Positive for heartburn (on PPI). Negative for abdominal pain, blood in stool, constipation, diarrhea, melena and nausea.  Genitourinary: Negative.  Negative for  dysuria, frequency, hematuria and urgency.  Musculoskeletal: Positive for joint pain (with strenuous exercise). Negative for back pain, falls and myalgias.  Skin: Negative.  Negative for itching and rash.  Neurological: Positive for headaches (chronic - sinus related). Negative for dizziness, tremors and weakness.       "Twitchy legs" attributed to exercise.  Endo/Heme/Allergies: Does not bruise/bleed easily.  Psychiatric/Behavioral: Negative for depression, memory loss and suicidal ideas. The patient is nervous/anxious (on xanax). The patient does not have insomnia.   All other systems reviewed and are negative.  Physical Exam: Blood pressure 103/68, pulse 82, temperature 98 F (36.7 C), temperature source Tympanic, resp. rate 18, weight 177 lb 8 oz (80.5 kg), SpO2 97 %. GENERAL:  Well developed, well nourished, woman sitting comfortably in the exam room in no acute distress. MENTAL STATUS:  Alert and oriented to person, place and time. HEAD:  Short brown hair.  Normocephalic, atraumatic, face symmetric, no Cushingoid features. EYES:  Blue eyes.  No conjunctivitis or scleral icterus. NEUROLOGICAL: Unremarkable. PSYCH:  Appropriate.    No visits with results within 3 Day(s) from this visit.  Latest known visit with results is:  Appointment on 11/04/2017  Component Date Value Ref Range Status  . WBC 11/04/2017 9.8  3.6 - 11.0 K/uL Final  . RBC 11/04/2017 4.77  3.80 - 5.20 MIL/uL Final  . Hemoglobin 11/04/2017 14.2  12.0 - 16.0 g/dL Final  . HCT 11/04/2017 41.5  35.0 - 47.0 % Final  . MCV 11/04/2017 87.0  80.0 - 100.0 fL Final  . MCH 11/04/2017 29.7  26.0 - 34.0 pg Final  . MCHC 11/04/2017 34.2  32.0 - 36.0 g/dL Final  . RDW 11/04/2017 14.2  11.5 - 14.5 % Final  . Platelets 11/04/2017 573* 150 - 440 K/uL Final  . Neutrophils Relative % 11/04/2017 65  % Final  . Neutro Abs 11/04/2017 6.5  1.4 - 6.5 K/uL Final  . Lymphocytes Relative 11/04/2017 24  % Final  . Lymphs Abs 11/04/2017 2.3   1.0 - 3.6 K/uL Final  . Monocytes Relative 11/04/2017 7  % Final  . Monocytes Absolute 11/04/2017 0.7  0.2 - 0.9 K/uL Final  . Eosinophils Relative 11/04/2017 3  % Final  . Eosinophils Absolute 11/04/2017 0.3  0 - 0.7 K/uL Final  . Basophils Relative 11/04/2017 1  % Final  . Basophils Absolute 11/04/2017 0.1  0 - 0.1 K/uL Final   Performed at [facility], [address] [place], [place] [address]  . Ferritin 11/04/2017 31  11 - 307 ng/mL Final   Performed at [facility], [address] [place],  [address]  . Iron 11/04/2017 72  28 - 170 ug/dL Final  . TIBC 11/04/2017 293  250 - 450 ug/dL Final  . Saturation Ratios 11/04/2017 25  10.4 - 31.8 % Final  . UIBC 11/04/2017 221  ug/dL Final   Performed at [facility], [address] [place], [place] [address]  . Sed Rate 11/04/2017 11  0 - 30 mm/hr Final   Performed at [facility], [address] [place], [place] [address]  . JAK2 GenotypR 11/04/2017 Comment*  Final   Comment: (NOTE) Result:  POSITIVE for the detection of the V617F mutation. Interpretation:  The assay detected the presence of a G to T nucleotide change encoding the V617F mutation within JAK2. Interpretation of this result should be made in the context of other clinical, morphologic, and cytogenetic findings.   Marland Kitchen BACKGROUND: 11/04/2017 Comment   Final   Comment: (NOTE) JAK2 is a cytoplasmic tyrosine kinase with a key role in signal transduction from multiple hematopoietic growth factor receptors. A point mutation within exon 14 of the JAK2 gene ([id number]) encoding a valine to phenylalanine substitution at position 617 of the JAK2 protein (V617F) has been identified in most patients with polycythemia vera, and in about half of those with either essential thrombocythemia or idiopathic myelofibrosis. The V617F has also been detected, although infrequently, in other myeloid disorders such as chronic myelomonocytic leukemia and  chronic neutrophilic luekemia. V617F is an acquired mutation that alters a highly conserved valine present in the negative regulatory JH2 domain of the JAK2 protein and is predicted to dysregulate kinase activity. Methodology: Total genomic DNA was extracted and subjected to TaqMan real-time PCR amplification/detection. Two amplification products per sample were monitored by real-time PCR using primers/probes s                          pecific to JAK2 wild type (WT) and JAK2 mutant V617F. The ABI7900 Absolute Quantitation software will compare the patient specimen valuse to the standard curves and generate percent values for wild type and mutant type. In vitro studies have indicated that this assay has an analytical sensitivity of 1%. References: Baxter EJ, Scott Phineas Real, et al. Acquired mutation of the tyrosine kinase JAK2 in human myeloproliferative disorders. Lancet. 2005 Mar 19-25; 365(9464):1054-1061. Alfonso Ramus Couedic JP. A unique clonal JAK2 mutation leading to constitutive signaling causes polycythaemia vera. Nature. 2005 Apr 28; 434(7037):1144-1148. Kralovics R, Passamonti F, Buser AS, et al. A gain-of-function mutation of JAK2 in myeloproliferative disorders. N Engl J Med. 2005 Apr 28; 352(17):1779-1790.   . Director Review, JAK2 11/04/2017 Comment   Final   Comment: (NOTE) Constance Goltz, PhD, [facility]               Director, [place] for [place], [place]               [phone] This test was developed and its performance characteristics determined by [facility]. It has not been cleared or approved by the Food and Drug Administration.   Marland Kitchen REFLEX: 11/04/2017 Comment   Final   Comment: (NOTE) Reflex to CALR Mutation Analysis, JAK2 Exon 12-15 Mutation Analysis, and MPL Mutation Analysis is not indicated.   Marland Kitchen Extraction 11/04/2017 Completed   Corrected    Comment: (NOTE) Performed At: [facility] [address] [place], [place] [id number] Nechama Guard MD YJ:[id number] Performed At: [facility] [place]  [address] [place], [place] [id number] Nechama Guard MD SP:[id number] Performed at [facility], [address], [place], [place] [address]   . Specimen Type 11/04/2017 BLOOD   Final  . Cells Counted 11/04/2017 200   Final  . Cells Analyzed 11/04/2017 200   Final  . FISH Result 11/04/2017 Comment:   Final   NORMAL: NO BCR OR ABL1 GENE REARRANGEMENT OBSERVED  . Interpretation 11/04/2017 Comment:   Final   Comment: (NOTE)             nuc ish 9q34(ASS1,ABL1)x2,22q11.2(BCRx2)[200].      The fluorescence in situ hybridization (FISH) study was normal. FISH, using unique sequence DNA probes for the ABL1 and BCR gene regions showed two ABL1 signals (red), two control ASS1 gene signals (aqua) located adjacent to the ABL1 locus at 9q34, and two BCR signals (green) at 22q11.2 in all interphase nuclei examined. There was NO evidence of CML or ALL-associated BCR/ABL1 dual fusion signals in this analysis. .      This analysis is limited to abnormalities detectable by the specific probes included in the study. FISH results should be interpreted within the context of a full cytogenetic analysis and pathology evaluation. Marland Kitchen  .This test was developed and its performance characteristics determined by [employer]). It has not been cleared or approved by the U.S. Food and Drug Administration. The DNA probe vendor for this study was [employer] (physical sciences))                          .   . 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  Platelet count has ranged between 457,000 - 590,000 for the past 4 years.  CBC on 05/05/2017 revealed a hematocrit of 38.5, hemoglobin 13, MCV 86.7, platelets 590,000, white count 10,800 and ANC of 7600.  Differential was unremarkable.  Work-up on 11/04/2017 revealed a hematocrit of 41.5, hemoglobin 14.2, MCV 87, platelets 573,000, WBC 9800 with an ANC of 6500.  Differential was normal.  Ferritin was 31.  Iron studies included a saturation of 25% and TIBC of 293.  Sed rate was 11.  BCR-ABL was negative.  JAK2 V617F was positive.  EGD on 07/25/2017 for epigastric pain and dysphasia revealed reflux esophagitis, a single gastric polyp (fundic gland polyp) normal duodenum.  H. pylori and Barrett's were negative.  Colonoscopy on 09/22/2017 revealed one small polyp in the mid transverse colon (tubular adenoma) and diverticulosis in the sigmoid colon.  She has a family history of colon cancer (twin brother and paternal aunt).  Symptomatically, she notes chronic fatigue.  She denies any B symptoms or recent infections. Exam reveals no adenopathy or hepatosplenomegaly.  Plan: 1. Discuss work-up.  Patient has JAK2 V617F mutation.  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  Discuss plan for management.    Discuss baseline bone marrow. Discuss risks of JAK2 (+) ET evolving into acute leukemia or myelofibrosis.   Discuss use of hydroxyurea to obtain a platelet count < 400,000.    Continue baby aspirin daily.  2.   Schedule bone marrow aspirate and biopsy. 3.   RTC in 10-14  days after bone marrow for MD assessment, labs (CBC with diff, CMP, von Willebrand panel), and initiation of hydroxyurea.   Honor Loh, NP  11/18/2017, 4:05 PM   I saw and evaluated the patient, participating in the key portions of the service and reviewing pertinent diagnostic studies and records.  I reviewed the nurse  practitioner's note and agree with the findings and the plan.  The assessment and plan were discussed with the patient.  Multiple questions were asked by the patient and answered.   Nolon Stalls, MD 11/18/2017, 4:05 PM

## 2017-11-21 ENCOUNTER — Telehealth: Payer: Self-pay | Admitting: *Deleted

## 2017-11-21 NOTE — Telephone Encounter (Signed)
Called patient and LVM that bone marrow biopsy has been scheduled for Tuesday, July 9.  Patient instructed to arrive @ [facility] @ 8:00 AM for 9:00 AM procedure.

## 2017-11-28 ENCOUNTER — Other Ambulatory Visit: Payer: Self-pay | Admitting: Student

## 2017-11-29 ENCOUNTER — Other Ambulatory Visit (HOSPITAL_COMMUNITY)
Admission: RE | Admit: 2017-11-29 | Discharge: 2017-11-29 | Disposition: A | Discharge status: Home / Self Care | Payer: Medicare Other | Source: Ambulatory Visit | Attending: Hematology and Oncology | Admitting: Hematology and Oncology

## 2017-11-29 ENCOUNTER — Ambulatory Visit
Admission: RE | Admit: 2017-11-29 | Discharge: 2017-11-29 | Disposition: A | Discharge status: Home / Self Care | Payer: Medicare Other | Source: Ambulatory Visit | Attending: Urgent Care | Admitting: Urgent Care

## 2017-11-29 DIAGNOSIS — Z800 Family history of malignant neoplasm of digestive organs: Secondary | ICD-10-CM | POA: Insufficient documentation

## 2017-11-29 DIAGNOSIS — C9290 Myeloid leukemia, unspecified, not having achieved remission: Secondary | ICD-10-CM | POA: Diagnosis not present

## 2017-11-29 DIAGNOSIS — I10 Essential (primary) hypertension: Secondary | ICD-10-CM | POA: Diagnosis not present

## 2017-11-29 DIAGNOSIS — Z7982 Long term (current) use of aspirin: Secondary | ICD-10-CM | POA: Insufficient documentation

## 2017-11-29 DIAGNOSIS — Z79899 Other long term (current) drug therapy: Secondary | ICD-10-CM | POA: Insufficient documentation

## 2017-11-29 DIAGNOSIS — Z803 Family history of malignant neoplasm of breast: Secondary | ICD-10-CM | POA: Insufficient documentation

## 2017-11-29 DIAGNOSIS — E559 Vitamin D deficiency, unspecified: Secondary | ICD-10-CM | POA: Diagnosis not present

## 2017-11-29 DIAGNOSIS — F419 Anxiety disorder, unspecified: Secondary | ICD-10-CM | POA: Insufficient documentation

## 2017-11-29 DIAGNOSIS — K219 Gastro-esophageal reflux disease without esophagitis: Secondary | ICD-10-CM | POA: Insufficient documentation

## 2017-11-29 DIAGNOSIS — D473 Essential (hemorrhagic) thrombocythemia: Secondary | ICD-10-CM

## 2017-11-29 DIAGNOSIS — Z885 Allergy status to narcotic agent status: Secondary | ICD-10-CM | POA: Insufficient documentation

## 2017-11-29 LAB — CBC
HCT: 39.4 % (ref 35.0–47.0)
Hemoglobin: 13.5 g/dL (ref 12.0–16.0)
MCH: 30.3 pg (ref 26.0–34.0)
MCHC: 34.4 g/dL (ref 32.0–36.0)
MCV: 88.2 fL (ref 80.0–100.0)
Platelets: 523 10*3/uL — ABNORMAL HIGH (ref 150–440)
RBC: 4.46 MIL/uL (ref 3.80–5.20)
RDW: 14.2 % (ref 11.5–14.5)
WBC: 9 10*3/uL (ref 3.6–11.0)

## 2017-11-29 LAB — PROTIME-INR
INR: 1.03
Prothrombin Time: 13.4 seconds (ref 11.4–15.2)

## 2017-11-29 LAB — APTT: aPTT: 32 seconds (ref 24–36)

## 2017-11-29 MED ORDER — MIDAZOLAM HCL 5 MG/5ML IJ SOLN
INTRAMUSCULAR | Status: AC
  Filled 2017-11-29: qty 5

## 2017-11-29 MED ORDER — SODIUM CHLORIDE 0.9 % IV SOLN
INTRAVENOUS | Status: DC
  Administered 2017-11-29: 09:00:00 via INTRAVENOUS

## 2017-11-29 MED ORDER — MIDAZOLAM HCL 2 MG/2ML IJ SOLN
INTRAMUSCULAR | Status: AC | PRN
  Administered 2017-11-29 (×2): 1 mg via INTRAVENOUS

## 2017-11-29 MED ORDER — HEPARIN SOD (PORK) LOCK FLUSH 100 UNIT/ML IV SOLN
INTRAVENOUS | Status: AC
  Filled 2017-11-29: qty 5

## 2017-11-29 MED ORDER — FENTANYL CITRATE (PF) 100 MCG/2ML IJ SOLN
INTRAMUSCULAR | Status: AC | PRN
  Administered 2017-11-29 (×2): 50 ug via INTRAVENOUS

## 2017-11-29 MED ORDER — FENTANYL CITRATE (PF) 100 MCG/2ML IJ SOLN
INTRAMUSCULAR | Status: AC
  Filled 2017-11-29: qty 4

## 2017-11-29 NOTE — H&P (Signed)
Chief Complaint: Patient was seen in consultation today for No chief complaint on file.  at the request of Hedrick,James  Referring Physician(s): Hedrick,James  Supervising Physician: Marybelle Killings  Patient Status: [facility] - Out-pt  History of Present Illness: Emily Wallace is a 68 y.o. female with thrombocytosis, referred for baseline BM Bx.  Past Medical History:  Diagnosis Date  . Anxiety   . GERD (gastroesophageal reflux disease)   . Hypertension   . Joint pain   . Vitamin D deficiency     Past Surgical History:  Procedure Laterality Date  . GALLBLADDER SURGERY      Allergies: Codone [hydrocodone]  Medications: Prior to Admission medications   Medication Sig Start Date End Date Taking? Authorizing Provider  ACAI PO Take by mouth daily. 1000 mg daily   Yes [provider]  aspirin EC 81 MG tablet Take 81 mg by mouth daily.   Yes [provider]  esomeprazole (NEXIUM) 40 MG capsule Take 40 mg by mouth daily at 12 noon.   Yes [provider]  KRILL OIL PO Take by mouth daily.   Yes [provider]  losartan (COZAAR) 50 MG tablet Take 50 mg by mouth daily.   Yes [provider]  Multiple Minerals-Vitamins (CALCIUM-MAGNESIUM-ZINC-D3 PO) Take by mouth 2 (two) times daily.   Yes [provider]  ranitidine (ZANTAC) 150 MG capsule Take 150 mg by mouth daily.   Yes [provider]  sertraline (ZOLOFT) 100 MG tablet Take 100 mg by mouth daily.   Yes [provider]  TURMERIC PO Take by mouth daily. 1500 mg daily   Yes [provider]  VITAMIN D, ERGOCALCIFEROL, PO Take 5,000 Units by mouth daily.   Yes [provider]     Family History  Problem Relation Age of Onset  . Colon cancer Brother   . Colon cancer Maternal Aunt   . Breast cancer Maternal Aunt   . Colon cancer Paternal Aunt   . Diabetes Maternal Grandmother   . Colon cancer Paternal Grandmother     Social History    Socioeconomic History  . Marital status: Married    Spouse name: Not on file  . Number of children: Not on file  . Years of education: Not on file  . Highest education level: Not on file  Occupational History  . Not on file  Social Needs  . Financial resource strain: Not on file  . Food insecurity:    Worry: Not on file    Inability: Not on file  . Transportation needs:    Medical: Not on file    Non-medical: Not on file  Tobacco Use  . Smoking status: Never Smoker  . Smokeless tobacco: Never Used  Substance and Sexual Activity  . Alcohol use: Not on file  . Drug use: Not on file  . Sexual activity: Not on file  Lifestyle  . Physical activity:    Days per week: Not on file    Minutes per session: Not on file  . Stress: Not on file  Relationships  . Social connections:    Talks on phone: Not on file    Gets together: Not on file    Attends religious service: Not on file    Active member of club or organization: Not on file    Attends meetings of clubs or organizations: Not on file    Relationship status: Not on file  Other Topics Concern  . Not on file  Social History Narrative  . Not on file     Review of Systems: A 12 point ROS discussed and pertinent positives are indicated in the HPI above.  All other systems are negative.  Review of Systems  Vital Signs: BP (!) 104/56 (BP Location: Left Arm)   Pulse 66   Temp 98.7 F (37.1 C) (Oral)   Resp 17   Ht '5\' 9"'$  (1.753 m)   Wt 177 lb (80.3 kg)   SpO2 98%   BMI 26.14 kg/m   Physical Exam  Constitutional: She is oriented to person, place, and time. She appears well-developed and well-nourished.  HENT:  Head: Normocephalic and atraumatic.  Cardiovascular: Normal rate and regular rhythm.  Pulmonary/Chest: Effort normal and breath sounds normal.  Neurological: She is alert and oriented to person, place, and time.    Imaging: No results found.  Labs:  CBC: Recent Labs    11/04/17 1242  11/29/17 0832  WBC 9.8 9.0  HGB 14.2 13.5  HCT 41.5 39.4  PLT 573* 523*    COAGS: Recent Labs    11/29/17 0832  INR 1.03  APTT 32    BMP: No results for input(s): NA, K, CL, CO2, GLUCOSE, BUN, CALCIUM, CREATININE, GFRNONAA, GFRAA in the last 8760 hours.  Invalid input(s): CMP  LIVER FUNCTION TESTS: No results for input(s): BILITOT, AST, ALT, ALKPHOS, PROT, ALBUMIN in the last 8760 hours.  TUMOR MARKERS: No results for input(s): AFPTM, CEA, CA199, CHROMGRNA in the last 8760 hours.  Assessment and Plan:  Thrombocytosis. BM Bx to follow.  Thank you for this interesting consult.  I greatly enjoyed meeting Emily Wallace and look forward to participating in their care.  A copy of this report was sent to the requesting provider on this date.  Electronically Signed: [place] Ndiaye, ART A, MD 11/29/2017, 9:42 AM   I spent a total of  40 Minutes   in face to face in clinical consultation, greater than 50% of which was counseling/coordinating care for bone marrow biopsy.

## 2017-11-29 NOTE — Discharge Instructions (Signed)
Moderate Conscious Sedation, Adult, Care After These instructions provide you with information about caring for yourself after your procedure. Your health care provider may also give you more specific instructions. Your treatment has been planned according to current medical practices, but problems sometimes occur. Call your health care provider if you have any problems or questions after your procedure. What can I expect after the procedure? After your procedure, it is common:  To feel sleepy for several hours.  To feel clumsy and have poor balance for several hours.  To have poor judgment for several hours.  To vomit if you eat too soon.  Follow these instructions at home: For at least 24 hours after the procedure:   Do not: ? Participate in activities where you could fall or become injured. ? Drive. ? Use heavy machinery. ? Drink alcohol. ? Take sleeping pills or medicines that cause drowsiness. ? Make important decisions or sign legal documents. ? Take care of children on your own.  Rest. Eating and drinking  Follow the diet recommended by your health care provider.  If you vomit: ? Drink water, juice, or soup when you can drink without vomiting. ? Make sure you have little or no nausea before eating solid foods. General instructions  Have a responsible adult stay with you until you are awake and alert.  Take over-the-counter and prescription medicines only as told by your health care provider.  If you smoke, do not smoke without supervision.  Keep all follow-up visits as told by your health care provider. This is important. Contact a health care provider if:  You keep feeling nauseous or you keep vomiting.  You feel light-headed.  You develop a rash.  You have a fever. Get help right away if:  You have trouble breathing. This information is not intended to replace advice given to you by your health care provider. Make sure you discuss any questions you have  with your health care provider. Document Released: 02/28/2013 Document Revised: 10/13/2015 Document Reviewed: 08/30/2015 Elsevier Interactive Patient Education  2018 [place]. Bone Marrow Aspiration and Bone Marrow Biopsy, Adult, Care After This sheet gives you information about how to care for yourself after your procedure. Your health care provider may also give you more specific instructions. If you have problems or questions, contact your health care provider. What can I expect after the procedure? After the procedure, it is common to have:  Mild pain and tenderness.  Swelling.  Bruising.  Follow these instructions at home:  Take over-the-counter or prescription medicines only as told by your health care provider.  Do not take baths, swim, or use a hot tub until your health care provider approves. Ask if you can take a shower or have a sponge bath.  Follow instructions from your health care provider about how to take care of the puncture site. Make sure you: ? Wash your hands with soap and water before you change your bandage (dressing). If soap and water are not available, use hand sanitizer. ? Change your dressing as told by your health care provider.  Check your puncture siteevery day for signs of infection. Check for: ? More redness, swelling, or pain. ? More fluid or blood. ? Warmth. ? Pus or a bad smell.  Return to your normal activities as told by your health care provider. Ask your health care provider what activities are safe for you.  Do not drive for 24 hours if you were given a medicine to help you relax (sedative).  Keep all follow-up visits as told by your health care provider. This is important. Contact a health care provider if:  You have more redness, swelling, or pain around the puncture site.  You have more fluid or blood coming from the puncture site.  Your puncture site feels warm to the touch.  You have pus or a bad smell coming from the  puncture site.  You have a fever.  Your pain is not controlled with medicine. This information is not intended to replace advice given to you by your health care provider. Make sure you discuss any questions you have with your health care provider. Document Released: 11/27/2004 Document Revised: 11/28/2015 Document Reviewed: 10/22/2015 Elsevier Interactive Patient Education  2018 [employer].

## 2017-11-29 NOTE — Procedures (Signed)
BM aspirate and core EBL 0 Comp 0 

## 2017-12-13 ENCOUNTER — Inpatient Hospital Stay (HOSPITAL_BASED_OUTPATIENT_CLINIC_OR_DEPARTMENT_OTHER): Payer: Medicare Other | Admitting: Urgent Care

## 2017-12-13 ENCOUNTER — Inpatient Hospital Stay: Payer: Medicare Other | Attending: Hematology and Oncology

## 2017-12-13 VITALS — BP 121/79 | HR 80 | Temp 97.4°F | Resp 18 | Wt 176.2 lb

## 2017-12-13 DIAGNOSIS — R5383 Other fatigue: Secondary | ICD-10-CM

## 2017-12-13 DIAGNOSIS — D473 Essential (hemorrhagic) thrombocythemia: Secondary | ICD-10-CM | POA: Diagnosis not present

## 2017-12-13 DIAGNOSIS — K317 Polyp of stomach and duodenum: Secondary | ICD-10-CM | POA: Insufficient documentation

## 2017-12-13 DIAGNOSIS — K5730 Diverticulosis of large intestine without perforation or abscess without bleeding: Secondary | ICD-10-CM

## 2017-12-13 DIAGNOSIS — Z5181 Encounter for therapeutic drug level monitoring: Secondary | ICD-10-CM

## 2017-12-13 DIAGNOSIS — Z800 Family history of malignant neoplasm of digestive organs: Secondary | ICD-10-CM

## 2017-12-13 DIAGNOSIS — R5382 Chronic fatigue, unspecified: Secondary | ICD-10-CM

## 2017-12-13 DIAGNOSIS — D123 Benign neoplasm of transverse colon: Secondary | ICD-10-CM

## 2017-12-13 DIAGNOSIS — K210 Gastro-esophageal reflux disease with esophagitis: Secondary | ICD-10-CM

## 2017-12-13 DIAGNOSIS — Z7964 Long term (current) use of myelosuppressive agent: Secondary | ICD-10-CM

## 2017-12-13 DIAGNOSIS — Z7982 Long term (current) use of aspirin: Secondary | ICD-10-CM | POA: Insufficient documentation

## 2017-12-13 LAB — CBC WITH DIFFERENTIAL/PLATELET
Basophils Absolute: 0.1 10*3/uL (ref 0–0.1)
Basophils Relative: 1 %
Eosinophils Absolute: 0.2 10*3/uL (ref 0–0.7)
Eosinophils Relative: 2 %
HCT: 45.1 % (ref 35.0–47.0)
Hemoglobin: 15.5 g/dL (ref 12.0–16.0)
Lymphocytes Relative: 19 %
Lymphs Abs: 2.2 10*3/uL (ref 1.0–3.6)
MCH: 29.8 pg (ref 26.0–34.0)
MCHC: 34.4 g/dL (ref 32.0–36.0)
MCV: 86.6 fL (ref 80.0–100.0)
Monocytes Absolute: 0.7 10*3/uL (ref 0.2–0.9)
Monocytes Relative: 6 %
Neutro Abs: 8.5 10*3/uL — ABNORMAL HIGH (ref 1.4–6.5)
Neutrophils Relative %: 72 %
Platelets: 619 10*3/uL — ABNORMAL HIGH (ref 150–440)
RBC: 5.21 MIL/uL — ABNORMAL HIGH (ref 3.80–5.20)
RDW: 14.1 % (ref 11.5–14.5)
WBC: 11.6 10*3/uL — ABNORMAL HIGH (ref 3.6–11.0)

## 2017-12-13 LAB — COMPREHENSIVE METABOLIC PANEL
ALT: 17 U/L (ref 0–44)
AST: 21 U/L (ref 15–41)
Albumin: 4.3 g/dL (ref 3.5–5.0)
Alkaline Phosphatase: 76 U/L (ref 38–126)
Anion gap: 9 (ref 5–15)
BUN: 24 mg/dL — ABNORMAL HIGH (ref 8–23)
CO2: 23 mmol/L (ref 22–32)
Calcium: 9.3 mg/dL (ref 8.9–10.3)
Chloride: 102 mmol/L (ref 98–111)
Creatinine, Ser: 0.94 mg/dL (ref 0.44–1.00)
GFR calc Af Amer: >60 mL/min (ref >60)
GFR calc non Af Amer: >60 mL/min (ref >60)
Glucose, Bld: 106 mg/dL — ABNORMAL HIGH (ref 70–99)
Potassium: 4.1 mmol/L (ref 3.5–5.1)
Sodium: 134 mmol/L — ABNORMAL LOW (ref 135–145)
Total Bilirubin: 0.9 mg/dL (ref 0.3–1.2)
Total Protein: 7.7 g/dL (ref 6.5–8.1)

## 2017-12-13 MED ORDER — HYDROXYUREA 500 MG PO CAPS: 500.0000 mg | ORAL_CAPSULE | Freq: Every day | ORAL | 0 refills | Status: DC

## 2017-12-13 NOTE — Progress Notes (Signed)
[facility] day:  12/13/2017  Chief Complaint: Emily Wallace is a 67 y.o. female with thrombocytosis who is seen for assessment and review of bone marrow biopsy.   HPI:  The patient was last seen in the hematology clinic on 11/18/2017.  At that time, patient continued to be fatigued.  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  Patient noted that her energy level was "normal for her".  He denied any acute complaints.  Exam was stable, revealing no hepatosplenomegaly or areas of palpable adenopathy.  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  Patient underwent bone marrow biopsy and aspiration on 11/29/2017 with Dr. Marybelle Killings (interventional radiology).  Notes reviewed and indicate an uncomplicated procedural course.  Pathology revealed a variably cellular marrow with involvement by JAK2 (+) myeloid neoplasm.  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  Reticulin and trichrome stains are pending to exclude primary myelofibrosis.  In the interim, patient has been "about the same". She continues to note that her energy level is "normal for her". She is fatigued, but she works out several days a week. She denies any new or acute symptoms. Patient is here today to review her bone marrow results.   Patient denies that she has experienced any B symptoms. She denies any interval infections. Patient advises that she maintains an adequate appetite. She is eating well. Weight today is 176 lb 4 oz (79.9 kg), which compared to her last visit to the clinic, represents a 1 pound weight loss.    Patient denies pain in the clinic today.  Past Medical History:  Diagnosis Date  . Anxiety   . GERD (gastroesophageal reflux disease)   . Hypertension   . Joint pain   . Vitamin D deficiency     Past Surgical History:  Procedure  Laterality Date  . GALLBLADDER SURGERY      Family History  Problem Relation Age of Onset  . Colon cancer Brother   . Colon cancer Maternal Aunt   . Breast cancer Maternal Aunt   . Colon cancer Paternal Aunt   . Diabetes Maternal Grandmother   . Colon cancer Paternal Grandmother     Social History:  reports that she has never smoked. She has never used smokeless tobacco. Her alcohol and drug histories are not on file.  Patient smoked "3 cigarettes" the whole time while she was in college. She has rarely drinks alcohol. Patient is a retired Visual merchandiser. Patient denies known exposures to radiation on toxins. She lives in [place].  The patient is accompanied by her husband, Emily Wallace, today.  Allergies:  Allergies  Allergen Reactions  . Codone [Hydrocodone] Rash    Current Medications: Current Outpatient Medications  Medication Sig Dispense Refill  . ACAI PO Take by mouth daily. 1000 mg daily    . aspirin EC 81 MG tablet Take 81 mg by mouth daily.    Marland Kitchen esomeprazole (NEXIUM) 40 MG capsule Take 40 mg by mouth daily at 12 noon.    Marland Kitchen KRILL OIL PO Take by mouth daily.    Marland Kitchen losartan (COZAAR) 50 MG tablet Take 50 mg by mouth daily.    . Multiple Minerals-Vitamins (CALCIUM-MAGNESIUM-ZINC-D3 PO) Take by mouth 2 (two) times daily.    . ranitidine (ZANTAC) 150 MG capsule Take 150 mg by mouth daily.    . sertraline (ZOLOFT) 100  MG tablet Take 100 mg by mouth daily.    . TURMERIC PO Take by mouth daily. 1500 mg daily    . VITAMIN D, ERGOCALCIFEROL, PO Take 5,000 Units by mouth daily.     No current facility-administered medications for this visit.     Review of Systems  Constitutional: Positive for malaise/fatigue. Negative for diaphoresis, fever and weight loss.  HENT: Negative.   Eyes: Negative.   Respiratory: Negative for cough, hemoptysis, sputum production and shortness of breath.   Cardiovascular: Negative for chest pain, palpitations, orthopnea, leg swelling and PND.   Gastrointestinal: Positive for heartburn (on PPI). Negative for abdominal pain, blood in stool, constipation, diarrhea, melena, nausea and vomiting.  Genitourinary: Negative for dysuria, frequency, hematuria and urgency.  Musculoskeletal: Positive for joint pain (associated mainly with strenuous exercise). Negative for back pain, falls and myalgias.  Skin: Negative for itching and rash.  Neurological: Positive for headaches (chronic - sinus related). Negative for dizziness, tremors and weakness.       "Twitchy legs" attributed to exercise.   Endo/Heme/Allergies: Does not bruise/bleed easily.  Psychiatric/Behavioral: Negative for depression, memory loss and suicidal ideas. The patient is nervous/anxious (on alprozolam). The patient does not have insomnia.   All other systems reviewed and are negative.  Performance status (ECOG): 0 - Asymptomatic  Vital Signs BP 121/79 (BP Location: Left Arm, Patient Position: Sitting)   Pulse 80   Temp (!) 97.4 F (36.3 C) (Tympanic)   Resp 18   Wt 176 lb 4 oz (79.9 kg)   BMI 26.03 kg/m   Physical Exam  Constitutional: She is oriented to person, place, and time and well-developed, well-nourished, and in no distress.  HENT:  Head: Normocephalic and atraumatic.  Brown hair  Eyes: Pupils are equal, round, and reactive to light. EOM are normal. No scleral icterus.  Blue eyes.   Neck: Normal range of motion. Neck supple. No tracheal deviation present. No thyromegaly present.  Cardiovascular: Normal rate, regular rhythm and normal heart sounds. Exam reveals no gallop and no friction rub.  No murmur heard. Pulmonary/Chest: Effort normal and breath sounds normal. No respiratory distress. She has no wheezes. She has no rales.  Abdominal: Soft. Bowel sounds are normal. She exhibits no distension. There is no tenderness.  Musculoskeletal: Normal range of motion. She exhibits no edema or tenderness.  Neurological: She is alert and oriented to person, place,  and time.  Skin: Skin is warm and dry. No rash noted. No erythema.  Psychiatric: Mood, affect and judgment normal.  Nursing note and vitals reviewed.   Appointment on 12/13/2017  Component Date Value Ref Range Status  . Sodium 12/13/2017 134* 135 - 145 mmol/L Final  . Potassium 12/13/2017 4.1  3.5 - 5.1 mmol/L Final  . Chloride 12/13/2017 102  98 - 111 mmol/L Final  . CO2 12/13/2017 23  22 - 32 mmol/L Final  . Glucose, Bld 12/13/2017 106* 70 - 99 mg/dL Final  . BUN 12/13/2017 24* 8 - 23 mg/dL Final  . Creatinine, Ser 12/13/2017 0.94  0.44 - 1.00 mg/dL Final  . Calcium 12/13/2017 9.3  8.9 - 10.3 mg/dL Final  . Total Protein 12/13/2017 7.7  6.5 - 8.1 g/dL Final  . Albumin 12/13/2017 4.3  3.5 - 5.0 g/dL Final  . AST 12/13/2017 21  15 - 41 U/L Final  . ALT 12/13/2017 17  0 - 44 U/L Final  . Alkaline Phosphatase 12/13/2017 76  38 - 126 U/L Final  . Total Bilirubin 12/13/2017 0.9  0.3 - 1.2 mg/dL Final  . GFR calc non Af Amer 12/13/2017 >60  >60 mL/min Final  . GFR calc Af Amer 12/13/2017 >60  >60 mL/min Final   Comment: (NOTE) The eGFR has been calculated using the CKD EPI equation. This calculation has not been validated in all clinical situations. eGFR's persistently <60 mL/min signify possible Chronic Kidney Disease.   Georgiann Hahn gap 12/13/2017 9  5 - 15 Final   Performed at [facility], [address] [place], [place] [address]  . WBC 12/13/2017 11.6* 3.6 - 11.0 K/uL Final  . RBC 12/13/2017 5.21* 3.80 - 5.20 MIL/uL Final  . Hemoglobin 12/13/2017 15.5  12.0 - 16.0 g/dL Final  . HCT 12/13/2017 45.1  35.0 - 47.0 % Final  . MCV 12/13/2017 86.6  80.0 - 100.0 fL Final  . MCH 12/13/2017 29.8  26.0 - 34.0 pg Final  . MCHC 12/13/2017 34.4  32.0 - 36.0 g/dL Final  . RDW 12/13/2017 14.1  11.5 - 14.5 % Final  . Platelets 12/13/2017 619* 150 - 440 K/uL Final  . Neutrophils Relative % 12/13/2017 72  % Final  . Neutro Abs 12/13/2017 8.5* 1.4 - 6.5 K/uL Final  . Lymphocytes Relative  12/13/2017 19  % Final  . Lymphs Abs 12/13/2017 2.2  1.0 - 3.6 K/uL Final  . Monocytes Relative 12/13/2017 6  % Final  . Monocytes Absolute 12/13/2017 0.7  0.2 - 0.9 K/uL Final  . Eosinophils Relative 12/13/2017 2  % Final  . Eosinophils Absolute 12/13/2017 0.2  0 - 0.7 K/uL Final  . Basophils Relative 12/13/2017 1  % Final  . 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  Platelet count has ranged between 457,000 - 590,000 for the past 4 years.  CBC on 05/05/2017 revealed a hematocrit of 38.5, hemoglobin 13, MCV 86.7, platelets 590,000, white count 10,800 and ANC of 7600.  Differential was unremarkable.  Work-up on 11/04/2017 revealed a hematocrit of 41.5, hemoglobin 14.2, MCV 87, platelets 573,000, WBC 9800 with an ANC of 6500.  Differential was normal.  Ferritin was 31.  Iron studies included a saturation of 25% and TIBC of 293.  Sed rate was 11.  BCR-ABL was negative.  JAK2 V617F was positive.  Bone marrow on 11/29/2017 revealed a variably cellular marrow with involvement by JAK2 (+) myeloid neoplasm consistent with essential thrombocythemia.    EGD on 07/25/2017 for epigastric pain and dysphasia revealed reflux esophagitis, a single gastric polyp (fundic gland polyp) normal duodenum.  H. pylori and Barrett's were negative.  Colonoscopy on 09/22/2017 revealed one small polyp in the mid transverse colon (tubular adenoma) and diverticulosis in the sigmoid colon.  She has a family history of colon cancer (twin brother and paternal aunt).  Symptomatically, she notes chronic fatigue.  She denies any B symptoms or recent infections. Exam reveals no adenopathy or hepatosplenomegaly.  Plan: 1. Labs today: CBC with diff, CMP, von Willebrand panel 2. Review bone marrow - variably cellular marrow with involvement by JAK2 (+)  myeloid neoplasm consistent with essential thrombocythemia.   3. Discuss essential thrombocytosis (ET).  Review plans for management.    Discuss baseline bone marrow testing that was (+) for JAK2 myeloid neoplasm.   Review risks of JAK2 (+) ET evolving into acute leukemia or myelofibrosis.   Review  plans to initiate hydroxyurea to obtain a platelet count < 400,000. Platelets are 619,000 today. Rx for hydroxyurea 500 mg daily sent to patient's pharmacy.     Continue baby aspirin daily.  4.  Patient requesting letter to submit to insurance company stating that she has a "cancer". She needs documentation of her condition to submit to her cancer policy carrier. Will draft letter once all results have returned on the bone marrow.  5.  RTC in 4 weeks for MD assessment, labs (CBC with diff, CMP), and titration of hydroxyurea dose if needed   Honor Loh, NP  12/13/2017, 2:52 PM

## 2017-12-13 NOTE — Progress Notes (Signed)
Patient offers no complaints today.  Patient is accompanied by her husband today.  He is wanting to know if ET is considered a blood cancer.  They are asking because of a stipulation in a cancer policy they have that will not pay unless a cancer diagnosis is given.

## 2017-12-14 ENCOUNTER — Encounter (HOSPITAL_COMMUNITY): Payer: Self-pay | Admitting: Hematology and Oncology

## 2017-12-15 LAB — VON WILLEBRAND PANEL
Coagulation Factor VIII: 112 % (ref 56–140)
Ristocetin Co-factor, Plasma: 88 % (ref 50–200)
Von Willebrand Antigen, Plasma: 137 % (ref 50–200)

## 2017-12-15 LAB — COAG STUDIES INTERP REPORT

## 2017-12-19 ENCOUNTER — Other Ambulatory Visit: Payer: Self-pay | Admitting: *Deleted

## 2017-12-19 DIAGNOSIS — D473 Essential (hemorrhagic) thrombocythemia: Secondary | ICD-10-CM

## 2017-12-20 ENCOUNTER — Encounter: Payer: Self-pay | Admitting: Urgent Care

## 2017-12-27 ENCOUNTER — Inpatient Hospital Stay: Payer: Medicare Other | Attending: Hematology and Oncology

## 2017-12-27 DIAGNOSIS — I10 Essential (primary) hypertension: Secondary | ICD-10-CM | POA: Insufficient documentation

## 2017-12-27 DIAGNOSIS — D473 Essential (hemorrhagic) thrombocythemia: Secondary | ICD-10-CM | POA: Insufficient documentation

## 2017-12-27 DIAGNOSIS — E559 Vitamin D deficiency, unspecified: Secondary | ICD-10-CM | POA: Diagnosis not present

## 2017-12-27 LAB — CBC WITH DIFFERENTIAL/PLATELET
Basophils Absolute: 0.1 10*3/uL (ref 0–0.1)
Basophils Relative: 1 %
Eosinophils Absolute: 0.2 10*3/uL (ref 0–0.7)
Eosinophils Relative: 2 %
HCT: 41.5 % (ref 35.0–47.0)
Hemoglobin: 13.8 g/dL (ref 12.0–16.0)
Lymphocytes Relative: 23 %
Lymphs Abs: 2.1 10*3/uL (ref 1.0–3.6)
MCH: 29.5 pg (ref 26.0–34.0)
MCHC: 33.2 g/dL (ref 32.0–36.0)
MCV: 88.7 fL (ref 80.0–100.0)
Monocytes Absolute: 0.7 10*3/uL (ref 0.2–0.9)
Monocytes Relative: 8 %
Neutro Abs: 6.1 10*3/uL (ref 1.4–6.5)
Neutrophils Relative %: 66 %
Platelets: 482 10*3/uL — ABNORMAL HIGH (ref 150–440)
RBC: 4.68 MIL/uL (ref 3.80–5.20)
RDW: 14.5 % (ref 11.5–14.5)
WBC: 9.1 10*3/uL (ref 3.6–11.0)

## 2018-01-09 ENCOUNTER — Encounter: Payer: Self-pay | Admitting: Hematology and Oncology

## 2018-01-09 ENCOUNTER — Other Ambulatory Visit: Payer: Self-pay

## 2018-01-09 ENCOUNTER — Inpatient Hospital Stay (HOSPITAL_BASED_OUTPATIENT_CLINIC_OR_DEPARTMENT_OTHER): Payer: Medicare Other | Admitting: Hematology and Oncology

## 2018-01-09 ENCOUNTER — Inpatient Hospital Stay: Payer: Medicare Other

## 2018-01-09 VITALS — BP 135/80 | HR 66 | Temp 96.2°F | Resp 18 | Wt 186.2 lb

## 2018-01-09 DIAGNOSIS — E559 Vitamin D deficiency, unspecified: Secondary | ICD-10-CM

## 2018-01-09 DIAGNOSIS — D473 Essential (hemorrhagic) thrombocythemia: Secondary | ICD-10-CM

## 2018-01-09 DIAGNOSIS — I10 Essential (primary) hypertension: Secondary | ICD-10-CM

## 2018-01-09 DIAGNOSIS — Z7189 Other specified counseling: Secondary | ICD-10-CM

## 2018-01-09 LAB — COMPREHENSIVE METABOLIC PANEL
ALT: 13 U/L (ref 0–44)
AST: 17 U/L (ref 15–41)
Albumin: 3.9 g/dL (ref 3.5–5.0)
Alkaline Phosphatase: 70 U/L (ref 38–126)
Anion gap: 10 (ref 5–15)
BUN: 22 mg/dL (ref 8–23)
CO2: 24 mmol/L (ref 22–32)
Calcium: 9.1 mg/dL (ref 8.9–10.3)
Chloride: 105 mmol/L (ref 98–111)
Creatinine, Ser: 0.87 mg/dL (ref 0.44–1.00)
GFR calc Af Amer: >60 mL/min (ref >60)
GFR calc non Af Amer: >60 mL/min (ref >60)
Glucose, Bld: 102 mg/dL — ABNORMAL HIGH (ref 70–99)
Potassium: 4.3 mmol/L (ref 3.5–5.1)
Sodium: 139 mmol/L (ref 135–145)
Total Bilirubin: 0.6 mg/dL (ref 0.3–1.2)
Total Protein: 7 g/dL (ref 6.5–8.1)

## 2018-01-09 LAB — CBC WITH DIFFERENTIAL/PLATELET
Basophils Absolute: 0.1 10*3/uL (ref 0–0.1)
Basophils Relative: 1 %
Eosinophils Absolute: 0.2 10*3/uL (ref 0–0.7)
Eosinophils Relative: 3 %
HCT: 40.6 % (ref 35.0–47.0)
Hemoglobin: 13.7 g/dL (ref 12.0–16.0)
Lymphocytes Relative: 23 %
Lymphs Abs: 2 10*3/uL (ref 1.0–3.6)
MCH: 30.1 pg (ref 26.0–34.0)
MCHC: 33.6 g/dL (ref 32.0–36.0)
MCV: 89.6 fL (ref 80.0–100.0)
Monocytes Absolute: 0.7 10*3/uL (ref 0.2–0.9)
Monocytes Relative: 8 %
Neutro Abs: 5.6 10*3/uL (ref 1.4–6.5)
Neutrophils Relative %: 65 %
Platelets: 488 10*3/uL — ABNORMAL HIGH (ref 150–440)
RBC: 4.53 MIL/uL (ref 3.80–5.20)
RDW: 15.1 % — ABNORMAL HIGH (ref 11.5–14.5)
WBC: 8.6 10*3/uL (ref 3.6–11.0)

## 2018-01-09 MED ORDER — HYDROXYUREA 500 MG PO CAPS: ORAL_CAPSULE | ORAL | 1 refills | Status: DC

## 2018-01-09 NOTE — Progress Notes (Signed)
Patient states since her last visit she found out she has another half sister who has the same blood disorder she has.   Patient states she has allergies that have kicked in recently.

## 2018-01-09 NOTE — Progress Notes (Signed)
[facility] day:  01/09/2018  Chief Complaint: Emily Wallace is a 67 y.o. female with essential thrombocytosis (ET) who is seen for 1 month assessment.  HPI:  The patient was last seen in the hematology clinic on 12/13/2017 by Honor Loh, NP.  At that time, bone marrow confirmed essential thrombocythemia (ET).  Reticulin and trichrome stains did not show significant increased in reticulin  fibers or collagen deposition.  Cytogenetics were normal (42, XX).  She began hydroxyurea 500 mg a day.  CBC on 12/13/2017:  Hematocrit 45.1, hemoglobin 15.5, MCV 86.6, platelets 619,000, WBC 11,600 with an ANC of 8500. CBC on 12/27/2017:  Hematocrit 41.5, hemoglobin 13.8, MCV 88.7, platelets 482,000, WBC 9100 with an ANC of 6100.  During the interim, patient is doing well. She denies any acute complaints. Patient recently found out that she has a half sister who has an ET diagnosis. Patient has never met this half sibling, but has plans to meet her for the first time this fall. She notes that her energy has improved overall. She denies any bruising or bleeding. She denies any negative side effects associated with her hydroxyurea therapy. Patient denies that she has experienced any B symptoms. She denies any interval infections.   Patient advises that she maintains an adequate appetite. She is eating well. Weight today is 186 lb 3 oz (84.5 kg), which compared to her last visit to the clinic, represents a 10 pound increase. She notes that she has been "binge eating". Her acid reflux has been increased because she is eating more after 8 pm. Patient states, "I told my trainer that I needed to get my s**t together".   Patient denies pain in the clinic today.   Past Medical History:  Diagnosis Date  . Anxiety   . GERD (gastroesophageal reflux disease)   . Hypertension   . Joint pain   . Vitamin D deficiency     Past Surgical History:  Procedure Laterality Date  .  GALLBLADDER SURGERY      Family History  Problem Relation Age of Onset  . Colon cancer Brother   . Colon cancer Maternal Aunt   . Breast cancer Maternal Aunt   . Colon cancer Paternal Aunt   . Diabetes Maternal Grandmother   . Colon cancer Paternal Grandmother     Social History:  reports that she has never smoked. She has never used smokeless tobacco. Her alcohol and drug histories are not on file.  Patient smoked "3 cigarettes" the whole time while she was in college. She has rarely drinks alcohol. Patient is a retired Visual merchandiser. Patient denies known exposures to radiation on toxins. She lives in [place].  She has been married for 43 years.  The patient is accompanied by her husband, Elta Guadeloupe, today.  Allergies:  Allergies  Allergen Reactions  . Codone [Hydrocodone] Rash    Current Medications: Current Outpatient Medications  Medication Sig Dispense Refill  . ACAI PO Take by mouth daily. 1000 mg daily    . aspirin EC 81 MG tablet Take 81 mg by mouth daily.    Marland Kitchen b complex vitamins capsule Take 1 capsule by mouth daily.    Marland Kitchen esomeprazole (NEXIUM) 40 MG capsule Take 40 mg by mouth daily at 12 noon.    Marland Kitchen KRILL OIL PO Take by mouth daily.    Marland Kitchen losartan (COZAAR) 50 MG tablet Take 50 mg by mouth daily.    . Multiple Minerals-Vitamins (CALCIUM-MAGNESIUM-ZINC-D3  PO) Take by mouth 2 (two) times daily.    . ranitidine (ZANTAC) 150 MG capsule Take 150 mg by mouth daily.    . sertraline (ZOLOFT) 100 MG tablet Take 100 mg by mouth daily.    . TURMERIC PO Take by mouth daily. 1500 mg daily    . VITAMIN D, ERGOCALCIFEROL, PO Take 5,000 Units by mouth daily.    . hydroxyurea (HYDREA) 500 MG capsule Take 2 pills (1000 mg) on M,W,F and 1 pill (500 mg) on T, Th, Sat, Sun. 120 capsule 1   No current facility-administered medications for this visit.     Review of Systems  Constitutional: Negative for diaphoresis, fever, malaise/fatigue and weight loss.  HENT: Negative.    Eyes: Negative.   Respiratory: Negative for cough, hemoptysis, sputum production and shortness of breath.   Cardiovascular: Negative for chest pain, palpitations, orthopnea, leg swelling and PND.  Gastrointestinal: Positive for heartburn (on PPI). Negative for abdominal pain, blood in stool, constipation, diarrhea, melena, nausea and vomiting.  Genitourinary: Negative for dysuria, frequency, hematuria and urgency.  Musculoskeletal: Positive for joint pain (associated mainly with strenuous exercise). Negative for back pain, falls and myalgias.  Skin: Negative for itching and rash.  Neurological: Positive for headaches (chronic - sinus related). Negative for dizziness, tremors and weakness.       "Twitchy legs" attributed to exercise.   Endo/Heme/Allergies: Does not bruise/bleed easily.  Psychiatric/Behavioral: Negative for depression, memory loss and suicidal ideas. The patient is nervous/anxious (on alprozolam). The patient does not have insomnia.   All other systems reviewed and are negative.  Performance status (ECOG): 0 - Asymptomatic  Vital Signs BP 135/80 (BP Location: Left Arm, Patient Position: Sitting)   Pulse 66   Temp (!) 96.2 F (35.7 C) (Tympanic)   Resp 18   Wt 186 lb 3 oz (84.5 kg)   BMI 27.50 kg/m   Physical Exam  Constitutional: She is oriented to person, place, and time and well-developed, well-nourished, and in no distress.  HENT:  Head: Normocephalic and atraumatic.  Short brown hair  Eyes: Pupils are equal, round, and reactive to light. EOM are normal. No scleral icterus.  Blue eyes.   Neck: Normal range of motion. Neck supple. No tracheal deviation present. No thyromegaly present.  Cardiovascular: Normal rate, regular rhythm and normal heart sounds. Exam reveals no gallop and no friction rub.  No murmur heard. Pulmonary/Chest: Effort normal and breath sounds normal. No respiratory distress. She has no wheezes. She has no rales.  Abdominal: Soft. Bowel sounds  are normal. She exhibits no distension. There is no tenderness.  Musculoskeletal: Normal range of motion. She exhibits no edema or tenderness.  Neurological: She is alert and oriented to person, place, and time.  Skin: Skin is warm and dry. No rash noted. No erythema.  Psychiatric: Mood, affect and judgment normal.  Nursing note and vitals reviewed.   Orders Only on 01/09/2018  Component Date Value Ref Range Status  . Sodium 01/09/2018 139  135 - 145 mmol/L Final  . Potassium 01/09/2018 4.3  3.5 - 5.1 mmol/L Final  . Chloride 01/09/2018 105  98 - 111 mmol/L Final  . CO2 01/09/2018 24  22 - 32 mmol/L Final  . Glucose, Bld 01/09/2018 102* 70 - 99 mg/dL Final  . BUN 01/09/2018 22  8 - 23 mg/dL Final  . Creatinine, Ser 01/09/2018 0.87  0.44 - 1.00 mg/dL Final  . Calcium 01/09/2018 9.1  8.9 - 10.3 mg/dL Final  . Total Protein  01/09/2018 7.0  6.5 - 8.1 g/dL Final  . Albumin 01/09/2018 3.9  3.5 - 5.0 g/dL Final  . AST 01/09/2018 17  15 - 41 U/L Final  . ALT 01/09/2018 13  0 - 44 U/L Final  . Alkaline Phosphatase 01/09/2018 70  38 - 126 U/L Final  . Total Bilirubin 01/09/2018 0.6  0.3 - 1.2 mg/dL Final  . GFR calc non Af Amer 01/09/2018 >60  >60 mL/min Final  . GFR calc Af Amer 01/09/2018 >60  >60 mL/min Final   Comment: (NOTE) The eGFR has been calculated using the CKD EPI equation. This calculation has not been validated in all clinical situations. eGFR's persistently <60 mL/min signify possible Chronic Kidney Disease.   Georgiann Hahn gap 01/09/2018 10  5 - 15 Final   Performed at [facility], [address] [place], [place] [address]  . WBC 01/09/2018 8.6  3.6 - 11.0 K/uL Final  . RBC 01/09/2018 4.53  3.80 - 5.20 MIL/uL Final  . Hemoglobin 01/09/2018 13.7  12.0 - 16.0 g/dL Final  . HCT 01/09/2018 40.6  35.0 - 47.0 % Final  . MCV 01/09/2018 89.6  80.0 - 100.0 fL Final  . MCH 01/09/2018 30.1  26.0 - 34.0 pg Final  . MCHC 01/09/2018 33.6  32.0 - 36.0 g/dL Final  . RDW 01/09/2018  15.1* 11.5 - 14.5 % Final  . Platelets 01/09/2018 488* 150 - 440 K/uL Final  . Neutrophils Relative % 01/09/2018 65  % Final  . Neutro Abs 01/09/2018 5.6  1.4 - 6.5 K/uL Final  . Lymphocytes Relative 01/09/2018 23  % Final  . Lymphs Abs 01/09/2018 2.0  1.0 - 3.6 K/uL Final  . Monocytes Relative 01/09/2018 8  % Final  . Monocytes Absolute 01/09/2018 0.7  0.2 - 0.9 K/uL Final  . Eosinophils Relative 01/09/2018 3  % Final  . Eosinophils Absolute 01/09/2018 0.2  0 - 0.7 K/uL Final  . Basophils Relative 01/09/2018 1  % Final  . Basophils Absolute 01/09/2018 0.1  0 - 0.1 K/uL Final   Performed at [facility], [address], [place], [place] [address]    Assessment:  CHANTALLE DEFILIPPO is a 67 y.o. 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  Platelet count has ranged between 457,000 - 590,000 for the past 4 years.  CBC on 05/05/2017 revealed a hematocrit of 38.5, hemoglobin 13, MCV 86.7, platelets 590,000, white count 10,800 and ANC of 7600.  Differential was unremarkable.  Work-up on 11/04/2017 revealed a hematocrit of 41.5, hemoglobin 14.2, MCV 87, platelets 573,000, WBC 9800 with an ANC of 6500.  Differential was normal.  Ferritin was 31.  Iron studies included a saturation of 25% and TIBC of 293.  Sed rate was 11.  BCR-ABL was negative.  JAK2 V617F was positive.  von Willebrand panel was normal on 12/13/2017.  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   Reticulin and trichrome stains did not show significant increased in reticulin  fibers or collagen deposition.  Cytogenetics were normal (60, XX).   She began hydroxyurea 500 mg/day on 12/13/2017.  She is on a baby aspirin.  EGD on 07/25/2017 for epigastric pain and dysphasia revealed reflux esophagitis, a single gastric polyp (fundic gland polyp) normal duodenum.  H. pylori and Barrett's were negative.  Colonoscopy on  09/22/2017 revealed one small polyp in the mid transverse colon (tubular adenoma) and diverticulosis in the sigmoid colon.  She has a family history of colon cancer (twin brother and paternal aunt).  Symptomatically, she denies any complaints.  Exam is stable. Platelet count is 488,000.  Plan: 1. Labs today: CBC with diff, CMP. 2.   Essential thrombocythemia (ET):  Review bone marrow since last visit.  Interval counts reviewed.  Hydroxyurea has been tolerated well.  Platelets have improved, but have leveled off and are above goal (400,000).  Increase hydrea to 10 pills /week (MWF 500 mg BID and TThSaSu 500 mg a day).  Continue baby aspirin a day. 3.  RTC in 4 weeks for MD assessment, labs (CBC with diff, CMP), and titration of hydroxyurea.     Honor Loh, NP 01/09/2018, 4:35 PM   I saw and evaluated the patient, participating in the key portions of the service and reviewing pertinent diagnostic studies and records.  I reviewed the nurse practitioner's note and agree with the findings and the plan.  The assessment and plan were discussed with the patient.  Several questions were asked by the patient and answered.   Nolon Stalls, MD 01/09/2018, 4:35 PM

## 2018-01-10 ENCOUNTER — Other Ambulatory Visit: Payer: Self-pay | Admitting: Urgent Care

## 2018-01-10 ENCOUNTER — Encounter: Payer: Self-pay | Admitting: Urgent Care

## 2018-01-10 MED ORDER — HYDROXYUREA 500 MG PO CAPS: ORAL_CAPSULE | ORAL | 1 refills | Status: DC

## 2018-02-05 NOTE — Progress Notes (Signed)
[facility] day:  02/06/18  Chief Complaint: Emily Wallace is a 67 y.o. 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  HPI:  The patient was last seen in the hematology clinic on 01/09/2018.  At that time, patient was doing well. She denied any acute concerns. Energy had improved overall. Learned that half sister had ET diagnosis. Eating well; weight up 10 pounds. Acid reflux had increased due to her eating late at night. No bruising or bleeding. Exam stable. Platelets were 488,000. Hydroxyurea was increased to 10 pills/week (500 mg BID MWF, and 500 mg daily on the other days).   In the interim, she has felt "fabulous".  She denies any concerns.   Past Medical History:  Diagnosis Date  . Anxiety   . GERD (gastroesophageal reflux disease)   . Hypertension   . Joint pain   . Vitamin D deficiency     Past Surgical History:  Procedure Laterality Date  . GALLBLADDER SURGERY      Family History  Problem Relation Age of Onset  . Colon cancer Brother   . Colon cancer Maternal Aunt   . Breast cancer Maternal Aunt   . Colon cancer Paternal Aunt   . Diabetes Maternal Grandmother   . Colon cancer Paternal Grandmother     Social History:  reports that she has never smoked. She has never used smokeless tobacco. Her alcohol and drug histories are not on file.  Patient smoked "3 cigarettes" the whole time while she was in college. She has rarely drinks alcohol. Patient is a retired Visual merchandiser. Patient denies known exposures to radiation on toxins. She lives in [place].  She has been married for 43 years.  The patient is accompanied by her husband, Emily Wallace, today.  Allergies:  Allergies  Allergen Reactions  . Codone [Hydrocodone] Rash    Current Medications: Current Outpatient Medications  Medication Sig Dispense Refill  . ACAI PO Take by mouth daily. 1000 mg daily    . aspirin  EC 81 MG tablet Take 81 mg by mouth daily.    Marland Kitchen b complex vitamins capsule Take 1 capsule by mouth daily.    Marland Kitchen esomeprazole (NEXIUM) 40 MG capsule Take 40 mg by mouth daily at 12 noon.    . hydroxyurea (HYDREA) 500 MG capsule Take 2 pills (1000 mg) on M,W,F and 1 pill (500 mg) on T, Th, Sat, Sun. 120 capsule 1  . losartan (COZAAR) 50 MG tablet Take 50 mg by mouth daily.    . sertraline (ZOLOFT) 100 MG tablet Take 100 mg by mouth daily.    . TURMERIC PO Take by mouth daily. 1500 mg daily    . VITAMIN D, ERGOCALCIFEROL, PO Take 5,000 Units by mouth daily.    . Multiple Minerals-Vitamins (CALCIUM-MAGNESIUM-ZINC-D3 PO) Take by mouth 2 (two) times daily.    . ranitidine (ZANTAC) 150 MG capsule Take 150 mg by mouth daily.     No current facility-administered medications for this visit.     Review of Systems  Constitutional: Positive for weight loss (4 pounds). Negative for chills, diaphoresis, fever and malaise/fatigue.       Feels "fabulous".  HENT: Negative.  Negative for congestion, ear discharge, ear pain, nosebleeds, sinus pain, sore throat and tinnitus.   Eyes: Negative.  Negative for blurred vision, double vision, photophobia, pain, discharge and redness.  Respiratory: Negative.  Negative for cough, hemoptysis, sputum production and shortness  of breath.   Cardiovascular: Negative.  Negative for chest pain, palpitations, orthopnea, leg swelling and PND.  Gastrointestinal: Positive for heartburn (on PPI). Negative for abdominal pain, blood in stool, constipation, diarrhea, melena, nausea and vomiting.  Genitourinary: Negative.  Negative for dysuria, frequency, hematuria and urgency.  Musculoskeletal: Negative.  Negative for back pain, falls, joint pain, myalgias and neck pain.  Skin: Negative.  Negative for itching and rash.  Neurological: Positive for headaches (chronic - related to sinuses). Negative for dizziness, tingling, tremors, sensory change, speech change, focal weakness and  weakness.  Endo/Heme/Allergies: Does not bruise/bleed easily.  Psychiatric/Behavioral: Negative for depression, memory loss and suicidal ideas. The patient is not nervous/anxious and does not have insomnia.   All other systems reviewed and are negative.  Performance status (ECOG): 0 - Asymptomatic  Vital Signs BP 122/75 (BP Location: Left Arm, Patient Position: Sitting)   Pulse 69   Temp (!) 97.1 F (36.2 C) (Tympanic)   Resp 18   Wt 182 lb 3.2 oz (82.6 kg)   BMI 26.91 kg/m   Physical Exam  Constitutional: She is oriented to person, place, and time and well-developed, well-nourished, and in no distress.  HENT:  Head: Normocephalic and atraumatic.  Brown hair  Eyes: Pupils are equal, round, and reactive to light. Conjunctivae and EOM are normal. No scleral icterus.  Blue eyes  Neck: Normal range of motion. Neck supple. No JVD present.  Cardiovascular: Normal rate, regular rhythm and normal heart sounds. Exam reveals no gallop and no friction rub.  No murmur heard. Pulmonary/Chest: Effort normal and breath sounds normal. No respiratory distress. She has no wheezes. She has no rales.  Abdominal: Soft. Bowel sounds are normal. She exhibits no distension and no mass. There is no tenderness. There is no rebound and no guarding.  Musculoskeletal: Normal range of motion. She exhibits no edema or tenderness.  Lymphadenopathy:    She has no cervical adenopathy.    She has no axillary adenopathy.       Right: No inguinal and no supraclavicular adenopathy present.       Left: No inguinal and no supraclavicular adenopathy present.  Neurological: She is alert and oriented to person, place, and time.  Skin: Skin is warm and dry. No rash noted. No erythema.  Left lower extremity bruise  Psychiatric: Mood, affect and judgment normal.  Nursing note and vitals reviewed.   Appointment on 02/06/2018  Component Date Value Ref Range Status  . Sodium 02/06/2018 139  135 - 145 mmol/L Final  .  Potassium 02/06/2018 4.0  3.5 - 5.1 mmol/L Final  . Chloride 02/06/2018 105  98 - 111 mmol/L Final  . CO2 02/06/2018 26  22 - 32 mmol/L Final  . Glucose, Bld 02/06/2018 105* 70 - 99 mg/dL Final  . BUN 02/06/2018 24* 8 - 23 mg/dL Final  . Creatinine, Ser 02/06/2018 1.03* 0.44 - 1.00 mg/dL Final  . Calcium 02/06/2018 9.4  8.9 - 10.3 mg/dL Final  . Total Protein 02/06/2018 7.2  6.5 - 8.1 g/dL Final  . Albumin 02/06/2018 4.0  3.5 - 5.0 g/dL Final  . AST 02/06/2018 16  15 - 41 U/L Final  . ALT 02/06/2018 12  0 - 44 U/L Final  . Alkaline Phosphatase 02/06/2018 63  38 - 126 U/L Final  . Total Bilirubin 02/06/2018 0.7  0.3 - 1.2 mg/dL Final  . GFR calc non Af Amer 02/06/2018 55* >60 mL/min Final  . GFR calc Af Amer 02/06/2018 >60  >60  mL/min Final   Comment: (NOTE) The eGFR has been calculated using the CKD EPI equation. This calculation has not been validated in all clinical situations. eGFR's persistently <60 mL/min signify possible Chronic Kidney Disease.   Georgiann Hahn gap 02/06/2018 8  5 - 15 Final   Performed at [facility], [address] [place], [place] [address]  . WBC 02/06/2018 8.1  3.6 - 11.0 K/uL Final  . RBC 02/06/2018 4.47  3.80 - 5.20 MIL/uL Final  . Hemoglobin 02/06/2018 13.8  12.0 - 16.0 g/dL Final  . HCT 02/06/2018 40.7  35.0 - 47.0 % Final  . MCV 02/06/2018 91.1  80.0 - 100.0 fL Final  . MCH 02/06/2018 30.9  26.0 - 34.0 pg Final  . MCHC 02/06/2018 34.0  32.0 - 36.0 g/dL Final  . RDW 02/06/2018 16.5* 11.5 - 14.5 % Final  . Platelets 02/06/2018 483* 150 - 440 K/uL Final  . Neutrophils Relative % 02/06/2018 65  % Final  . Neutro Abs 02/06/2018 5.3  1.4 - 6.5 K/uL Final  . Lymphocytes Relative 02/06/2018 24  % Final  . Lymphs Abs 02/06/2018 2.0  1.0 - 3.6 K/uL Final  . Monocytes Relative 02/06/2018 8  % Final  . Monocytes Absolute 02/06/2018 0.6  0.2 - 0.9 K/uL Final  . Eosinophils Relative 02/06/2018 2  % Final  . Eosinophils Absolute 02/06/2018 0.2  0 - 0.7 K/uL  Final  . Basophils Relative 02/06/2018 1  % Final  . Basophils Absolute 02/06/2018 0.1  0 - 0.1 K/uL Final   Performed at [facility], [address] [place], [place] [address]    Assessment:  WILLADENE MOUNSEY is a 67 y.o. 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  Platelet count has ranged between 457,000 - 590,000 for the past 4 years.  CBC on 05/05/2017 revealed a hematocrit of 38.5, hemoglobin 13, MCV 86.7, platelets 590,000, white count 10,800 and ANC of 7600.  Differential was unremarkable.  Work-up on 11/04/2017 revealed a hematocrit of 41.5, hemoglobin 14.2, MCV 87, platelets 573,000, WBC 9800 with an ANC of 6500.  Differential was normal.  Ferritin was 31.  Iron studies included a saturation of 25% and TIBC of 293.  Sed rate was 11.  BCR-ABL was negative.  JAK2 V617F was positive.  von Willebrand panel was normal on 12/13/2017.  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   Reticulin and trichrome stains did not show significant increased in reticulin  fibers or collagen deposition.  Cytogenetics were normal (65, XX).   She began hydroxyurea 500 mg/day on 12/13/2017.  Dose increased to 500 mg BID on MWF and 500 mg daily on T,Th,S,S on 01/09/2018. She is on a baby aspirin.  EGD on 07/25/2017 for epigastric pain and dysphasia revealed reflux esophagitis, a single gastric polyp (fundic gland polyp) normal duodenum.  H. pylori and Barrett's were negative.  Colonoscopy on 09/22/2017 revealed one small polyp in the mid transverse colon (tubular adenoma) and diverticulosis in the sigmoid colon.  She has a family history of colon cancer (twin brother and paternal aunt).  Symptomatically, she is doing well.  Exam is normal.  Platelet count is 483,000.  Plan: 1. Labs today: CBC with diff, CMP. 2. Essential thrombocythemia  Labs reviewed. Platelets 483,000.  Tolerating  treatment well. No bruising or bleeding. Remains active.   Increase hydroxyurea to 500 mg BID.  Continue ASA 81 mg daily.  3.  RTC in 3 weeks for labs (CBC with diff). 4. RTC in 6 weeks for MD assessment, labs (CBC with diff, CMP), and titration of hydroxyurea.   Honor Loh, NP 02/06/18, 11:21 AM  I saw and evaluated the patient, participating in the key portions of the service and reviewing pertinent diagnostic studies and records.  I reviewed the nurse practitioner's note and agree with the findings and the plan.  The assessment and plan were discussed with the patient.  A few questions were asked by the patient and answered.   Nolon Stalls, MD 02/06/18, 11:21 AM

## 2018-02-06 ENCOUNTER — Inpatient Hospital Stay: Payer: Medicare Other | Admitting: Hematology and Oncology

## 2018-02-06 ENCOUNTER — Encounter: Payer: Self-pay | Admitting: Hematology and Oncology

## 2018-02-06 ENCOUNTER — Other Ambulatory Visit: Payer: Self-pay

## 2018-02-06 ENCOUNTER — Inpatient Hospital Stay: Payer: Medicare Other | Attending: Hematology and Oncology

## 2018-02-06 VITALS — BP 122/75 | HR 69 | Temp 97.1°F | Resp 18 | Wt 182.2 lb

## 2018-02-06 DIAGNOSIS — D473 Essential (hemorrhagic) thrombocythemia: Secondary | ICD-10-CM | POA: Diagnosis present

## 2018-02-06 LAB — COMPREHENSIVE METABOLIC PANEL
ALT: 12 U/L (ref 0–44)
AST: 16 U/L (ref 15–41)
Albumin: 4 g/dL (ref 3.5–5.0)
Alkaline Phosphatase: 63 U/L (ref 38–126)
Anion gap: 8 (ref 5–15)
BUN: 24 mg/dL — ABNORMAL HIGH (ref 8–23)
CO2: 26 mmol/L (ref 22–32)
Calcium: 9.4 mg/dL (ref 8.9–10.3)
Chloride: 105 mmol/L (ref 98–111)
Creatinine, Ser: 1.03 mg/dL — ABNORMAL HIGH (ref 0.44–1.00)
GFR calc Af Amer: >60 mL/min (ref >60)
GFR calc non Af Amer: 55 mL/min — ABNORMAL LOW (ref >60)
Glucose, Bld: 105 mg/dL — ABNORMAL HIGH (ref 70–99)
Potassium: 4 mmol/L (ref 3.5–5.1)
Sodium: 139 mmol/L (ref 135–145)
Total Bilirubin: 0.7 mg/dL (ref 0.3–1.2)
Total Protein: 7.2 g/dL (ref 6.5–8.1)

## 2018-02-06 LAB — CBC WITH DIFFERENTIAL/PLATELET
Basophils Absolute: 0.1 10*3/uL (ref 0–0.1)
Basophils Relative: 1 %
Eosinophils Absolute: 0.2 10*3/uL (ref 0–0.7)
Eosinophils Relative: 2 %
HCT: 40.7 % (ref 35.0–47.0)
Hemoglobin: 13.8 g/dL (ref 12.0–16.0)
Lymphocytes Relative: 24 %
Lymphs Abs: 2 10*3/uL (ref 1.0–3.6)
MCH: 30.9 pg (ref 26.0–34.0)
MCHC: 34 g/dL (ref 32.0–36.0)
MCV: 91.1 fL (ref 80.0–100.0)
Monocytes Absolute: 0.6 10*3/uL (ref 0.2–0.9)
Monocytes Relative: 8 %
Neutro Abs: 5.3 10*3/uL (ref 1.4–6.5)
Neutrophils Relative %: 65 %
Platelets: 483 10*3/uL — ABNORMAL HIGH (ref 150–440)
RBC: 4.47 MIL/uL (ref 3.80–5.20)
RDW: 16.5 % — ABNORMAL HIGH (ref 11.5–14.5)
WBC: 8.1 10*3/uL (ref 3.6–11.0)

## 2018-02-06 NOTE — Progress Notes (Signed)
Here for follow up per pt overall she is " doing great "

## 2018-03-06 ENCOUNTER — Inpatient Hospital Stay: Payer: Medicare Other | Attending: Hematology and Oncology

## 2018-03-06 DIAGNOSIS — D473 Essential (hemorrhagic) thrombocythemia: Secondary | ICD-10-CM | POA: Insufficient documentation

## 2018-03-06 LAB — CBC WITH DIFFERENTIAL/PLATELET
Abs Immature Granulocytes: 0.02 10*3/uL (ref 0.00–0.07)
Basophils Absolute: 0 10*3/uL (ref 0.0–0.1)
Basophils Relative: 1 %
Eosinophils Absolute: 0.1 10*3/uL (ref 0.0–0.5)
Eosinophils Relative: 1 %
HCT: 39.4 % (ref 36.0–46.0)
Hemoglobin: 13.1 g/dL (ref 12.0–15.0)
Immature Granulocytes: 0 %
Lymphocytes Relative: 24 %
Lymphs Abs: 1.9 10*3/uL (ref 0.7–4.0)
MCH: 31.4 pg (ref 26.0–34.0)
MCHC: 33.2 g/dL (ref 30.0–36.0)
MCV: 94.5 fL (ref 80.0–100.0)
Monocytes Absolute: 0.6 10*3/uL (ref 0.1–1.0)
Monocytes Relative: 8 %
Neutro Abs: 5.3 10*3/uL (ref 1.7–7.7)
Neutrophils Relative %: 66 %
Platelets: 335 10*3/uL (ref 150–400)
RBC: 4.17 MIL/uL (ref 3.87–5.11)
RDW: 17 % — ABNORMAL HIGH (ref 11.5–15.5)
WBC: 8 10*3/uL (ref 4.0–10.5)
nRBC: 0 % (ref 0.0–0.2)

## 2018-03-20 ENCOUNTER — Inpatient Hospital Stay (HOSPITAL_BASED_OUTPATIENT_CLINIC_OR_DEPARTMENT_OTHER): Payer: Medicare Other | Admitting: Hematology and Oncology

## 2018-03-20 ENCOUNTER — Encounter: Payer: Self-pay | Admitting: Hematology and Oncology

## 2018-03-20 ENCOUNTER — Inpatient Hospital Stay: Payer: Medicare Other

## 2018-03-20 VITALS — BP 132/77 | HR 87 | Temp 97.0°F | Resp 18 | Ht 69.0 in | Wt 181.7 lb

## 2018-03-20 DIAGNOSIS — D473 Essential (hemorrhagic) thrombocythemia: Secondary | ICD-10-CM | POA: Diagnosis not present

## 2018-03-20 LAB — CBC WITH DIFFERENTIAL/PLATELET
Abs Immature Granulocytes: 0.02 10*3/uL (ref 0.00–0.07)
Basophils Absolute: 0 10*3/uL (ref 0.0–0.1)
Basophils Relative: 1 %
Eosinophils Absolute: 0.2 10*3/uL (ref 0.0–0.5)
Eosinophils Relative: 2 %
HCT: 38.2 % (ref 36.0–46.0)
Hemoglobin: 12.8 g/dL (ref 12.0–15.0)
Immature Granulocytes: 0 %
Lymphocytes Relative: 25 %
Lymphs Abs: 2 10*3/uL (ref 0.7–4.0)
MCH: 32.5 pg (ref 26.0–34.0)
MCHC: 33.5 g/dL (ref 30.0–36.0)
MCV: 97 fL (ref 80.0–100.0)
Monocytes Absolute: 0.8 10*3/uL (ref 0.1–1.0)
Monocytes Relative: 10 %
Neutro Abs: 4.9 10*3/uL (ref 1.7–7.7)
Neutrophils Relative %: 62 %
Platelets: 342 10*3/uL (ref 150–400)
RBC: 3.94 MIL/uL (ref 3.87–5.11)
RDW: 16.7 % — ABNORMAL HIGH (ref 11.5–15.5)
WBC: 7.8 10*3/uL (ref 4.0–10.5)
nRBC: 0 % (ref 0.0–0.2)

## 2018-03-20 LAB — COMPREHENSIVE METABOLIC PANEL
ALT: 14 U/L (ref 0–44)
AST: 21 U/L (ref 15–41)
Albumin: 4 g/dL (ref 3.5–5.0)
Alkaline Phosphatase: 64 U/L (ref 38–126)
Anion gap: 5 (ref 5–15)
BUN: 18 mg/dL (ref 8–23)
CO2: 28 mmol/L (ref 22–32)
Calcium: 9.3 mg/dL (ref 8.9–10.3)
Chloride: 107 mmol/L (ref 98–111)
Creatinine, Ser: 0.97 mg/dL (ref 0.44–1.00)
GFR calc Af Amer: >60 mL/min (ref >60)
GFR calc non Af Amer: 59 mL/min — ABNORMAL LOW (ref >60)
Glucose, Bld: 92 mg/dL (ref 70–99)
Potassium: 4.1 mmol/L (ref 3.5–5.1)
Sodium: 140 mmol/L (ref 135–145)
Total Bilirubin: 0.4 mg/dL (ref 0.3–1.2)
Total Protein: 7.2 g/dL (ref 6.5–8.1)

## 2018-03-20 MED ORDER — HYDROXYUREA 500 MG PO CAPS: 500.0000 mg | ORAL_CAPSULE | Freq: Two times a day (BID) | ORAL | 5 refills | Status: DC

## 2018-03-20 NOTE — Progress Notes (Signed)
No new changes noted today 

## 2018-03-20 NOTE — Progress Notes (Signed)
[facility] day:  03/20/18  Chief Complaint: Emily Wallace is a 67 y.o. 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  HPI:  The patient was last seen in the hematology clinic on 02/06/2018.  At that time, she was doing well.  Exam was normal.  Platelet count was 483,000.  Hydroxyurea was increased to 500 mg BID.  CBC on 03/06/2018 revealed a hematocrit of 39.4, hemoglobin 13.1, MCV 94.5, platelets 335,000, and WBC 8000 with an ANC of 5300.  During the interim, she has done well.  She denies any symptoms on hydroxyurea.  She notes some symptoms associated with the weather and her allergies.   Past Medical History:  Diagnosis Date  . Anxiety   . GERD (gastroesophageal reflux disease)   . Hypertension   . Joint pain   . Vitamin D deficiency     Past Surgical History:  Procedure Laterality Date  . GALLBLADDER SURGERY      Family History  Problem Relation Age of Onset  . Colon cancer Brother   . Colon cancer Maternal Aunt   . Breast cancer Maternal Aunt   . Colon cancer Paternal Aunt   . Diabetes Maternal Grandmother   . Colon cancer Paternal Grandmother     Social History:  reports that she has never smoked. She has never used smokeless tobacco. Her alcohol and drug histories are not on file.  Patient smoked "3 cigarettes" the whole time while she was in college. She has rarely drinks alcohol. Patient is a retired Visual merchandiser. Patient denies known exposures to radiation on toxins. She lives in [place].  She has been married for 43 years.  The patient is accompanied by her husband, Elta Guadeloupe, today.  Allergies:  Allergies  Allergen Reactions  . Codone [Hydrocodone] Rash    Current Medications: Current Outpatient Medications  Medication Sig Dispense Refill  . ACAI PO Take by mouth daily. 1000 mg daily    . aspirin EC 81 MG tablet Take 81 mg by mouth daily.    Marland Kitchen b  complex vitamins capsule Take 1 capsule by mouth daily.    Marland Kitchen esomeprazole (NEXIUM) 40 MG capsule Take 40 mg by mouth daily at 12 noon.    . famotidine (PEPCID) 40 MG tablet Take 40 mg by mouth daily.    . hydroxyurea (HYDREA) 500 MG capsule Take 2 pills (1000 mg) on M,W,F and 1 pill (500 mg) on T, Th, Sat, Sun. (Patient taking differently: 1,000 mg 2 (two) times daily. ) 120 capsule 1  . losartan (COZAAR) 50 MG tablet Take 50 mg by mouth daily.    . Multiple Minerals-Vitamins (CALCIUM-MAGNESIUM-ZINC-D3 PO) Take by mouth 2 (two) times daily.    . sertraline (ZOLOFT) 100 MG tablet Take 100 mg by mouth daily.    . TURMERIC PO Take by mouth daily. 1500 mg daily    . VITAMIN D, ERGOCALCIFEROL, PO Take 5,000 Units by mouth daily.    . ranitidine (ZANTAC) 150 MG capsule Take 150 mg by mouth daily.     No current facility-administered medications for this visit.     Review of Systems  Constitutional: Positive for weight loss (1 pound). Negative for chills, diaphoresis, fever and malaise/fatigue.       Feels "fine".  HENT: Negative.  Negative for congestion, ear discharge, ear pain, nosebleeds, sinus pain, sore throat and tinnitus.        Allergies.  Eyes:  Negative.  Negative for blurred vision, double vision, photophobia, pain, discharge and redness.  Respiratory: Negative.  Negative for cough, hemoptysis, sputum production and shortness of breath.   Cardiovascular: Negative.  Negative for chest pain, palpitations, orthopnea, leg swelling and PND.  Gastrointestinal: Positive for heartburn (on PPI). Negative for abdominal pain, blood in stool, constipation, diarrhea, melena, nausea and vomiting.       Switched from Zantac to [employer].  Genitourinary: Negative.  Negative for dysuria, frequency, hematuria and urgency.  Musculoskeletal: Negative.  Negative for back pain, falls, joint pain, myalgias and neck pain.  Skin: Negative.  Negative for itching and rash.  Neurological: Positive for headaches  (chronic - related to sinuses). Negative for dizziness, tingling, tremors, sensory change, speech change, focal weakness and weakness.  Endo/Heme/Allergies: Does not bruise/bleed easily.  Psychiatric/Behavioral: Negative for depression, memory loss and suicidal ideas. The patient is not nervous/anxious and does not have insomnia.   All other systems reviewed and are negative.  Performance status (ECOG): 0 - Asymptomatic  Vital Signs BP 132/77 (BP Location: Left Arm, Patient Position: Sitting)   Pulse 87   Temp (!) 97 F (36.1 C) (Tympanic)   Resp 18   Ht 5\' 9"  (1.753 m)   Wt 181 lb 11.2 oz (82.4 kg)   SpO2 96%   BMI 26.83 kg/m   Physical Exam  Constitutional: She is oriented to person, place, and time and well-developed, well-nourished, and in no distress.  HENT:  Head: Normocephalic and atraumatic.  Brown styled hair  Eyes: Pupils are equal, round, and reactive to light. Conjunctivae and EOM are normal. No scleral icterus.  Blue eyes  Neck: Normal range of motion. Neck supple. No JVD present.  Cardiovascular: Normal rate, regular rhythm and normal heart sounds. Exam reveals no gallop and no friction rub.  No murmur heard. Pulmonary/Chest: Effort normal and breath sounds normal. No respiratory distress. She has no wheezes. She has no rales.  Abdominal: Soft. Bowel sounds are normal. She exhibits no distension and no mass. There is no tenderness. There is no rebound and no guarding.  Musculoskeletal: Normal range of motion. She exhibits no edema, tenderness or deformity.  Lymphadenopathy:    She has no cervical adenopathy.    She has no axillary adenopathy.       Right: No inguinal and no supraclavicular adenopathy present.       Left: No inguinal and no supraclavicular adenopathy present.  Neurological: She is alert and oriented to person, place, and time. Gait normal.  Skin: Skin is warm and dry. No rash noted. No erythema.  Psychiatric: Mood, affect and judgment normal.   Nursing note and vitals reviewed.   No visits with results within 3 Day(s) from this visit.  Latest known visit with results is:  Appointment on 03/06/2018  Component Date Value Ref Range Status  . WBC 03/06/2018 8.0  4.0 - 10.5 K/uL Final  . RBC 03/06/2018 4.17  3.87 - 5.11 MIL/uL Final  . Hemoglobin 03/06/2018 13.1  12.0 - 15.0 g/dL Final  . HCT 03/06/2018 39.4  36.0 - 46.0 % Final  . MCV 03/06/2018 94.5  80.0 - 100.0 fL Final  . MCH 03/06/2018 31.4  26.0 - 34.0 pg Final  . MCHC 03/06/2018 33.2  30.0 - 36.0 g/dL Final  . RDW 03/06/2018 17.0* 11.5 - 15.5 % Final  . Platelets 03/06/2018 335  150 - 400 K/uL Final  . nRBC 03/06/2018 0.0  0.0 - 0.2 % Final  . Neutrophils Relative %  03/06/2018 66  % Final  . Neutro Abs 03/06/2018 5.3  1.7 - 7.7 K/uL Final  . Lymphocytes Relative 03/06/2018 24  % Final  . Lymphs Abs 03/06/2018 1.9  0.7 - 4.0 K/uL Final  . Monocytes Relative 03/06/2018 8  % Final  . Monocytes Absolute 03/06/2018 0.6  0.1 - 1.0 K/uL Final  . Eosinophils Relative 03/06/2018 1  % Final  . Eosinophils Absolute 03/06/2018 0.1  0.0 - 0.5 K/uL Final  . Basophils Relative 03/06/2018 1  % Final  . Basophils Absolute 03/06/2018 0.0  0.0 - 0.1 K/uL Final  . Immature Granulocytes 03/06/2018 0  % Final  . Abs Immature Granulocytes 03/06/2018 0.02  0.00 - 0.07 K/uL Final   Performed at [facility], [address] [place], [place] [address]    Assessment:  KAORU BENDA is a 67 y.o. 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  Platelet count has ranged between 457,000 - 590,000 for the past 4 years.  CBC on 05/05/2017 revealed a hematocrit of 38.5, hemoglobin 13, MCV 86.7, platelets 590,000, white count 10,800 and ANC of 7600.  Differential was unremarkable.  Work-up on 11/04/2017 revealed a hematocrit of 41.5, hemoglobin 14.2, MCV 87, platelets 573,000, WBC 9800 with an ANC of 6500.  Differential was normal.  Ferritin was 31.  Iron studies included a saturation of 25% and  TIBC of 293.  Sed rate was 11.  BCR-ABL was negative.  JAK2 V617F was positive.  von Willebrand panel was normal on 12/13/2017.  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   Reticulin and trichrome stains did not show significant increased in reticulin  fibers or collagen deposition.  Cytogenetics were normal (23, XX).   She began hydroxyurea 500 mg/day on 12/13/2017.  Dose increased to 500 mg BID on MWF and 500 mg daily on T,Th,S,S on 01/09/2018. She is on a baby aspirin.  EGD on 07/25/2017 for epigastric pain and dysphasia revealed reflux esophagitis, a single gastric polyp (fundic gland polyp) normal duodenum.  H. pylori and Barrett's were negative.  Colonoscopy on 09/22/2017 revealed one small polyp in the mid transverse colon (tubular adenoma) and diverticulosis in the sigmoid colon.  She has a family history of colon cancer (twin brother and paternal aunt).  Symptomatically, she is doing well.  Exam is normal.  Platelet count is 342,000.  Plan: 1.   Labs today:  CBC with diff, CMP. 2.   Essential thrombocythemia  Labs reviewed. Platelets 342,000.  Tolerating treatment well.  Continue hydroxyurea to 500 mg BID.  Continue ASA 81 mg daily.  3.   RTC in 6 weeks for labs (CBC with diff, CMP). 4.   RTC in 3 months for MD assessment and labs (CBC with diff, CMP).   Nolon Stalls, MD 03/20/18, 2:01 PM

## 2018-05-01 ENCOUNTER — Other Ambulatory Visit: Payer: Self-pay

## 2018-05-01 ENCOUNTER — Inpatient Hospital Stay: Payer: Medicare Other | Attending: Hematology and Oncology

## 2018-05-01 DIAGNOSIS — D473 Essential (hemorrhagic) thrombocythemia: Secondary | ICD-10-CM

## 2018-05-01 LAB — COMPREHENSIVE METABOLIC PANEL
ALT: 16 U/L (ref 0–44)
AST: 19 U/L (ref 15–41)
Albumin: 4 g/dL (ref 3.5–5.0)
Alkaline Phosphatase: 69 U/L (ref 38–126)
Anion gap: 9 (ref 5–15)
BUN: 21 mg/dL (ref 8–23)
CO2: 26 mmol/L (ref 22–32)
Calcium: 9.2 mg/dL (ref 8.9–10.3)
Chloride: 104 mmol/L (ref 98–111)
Creatinine, Ser: 1.16 mg/dL — ABNORMAL HIGH (ref 0.44–1.00)
GFR calc Af Amer: 56 mL/min — ABNORMAL LOW (ref >60)
GFR calc non Af Amer: 49 mL/min — ABNORMAL LOW (ref >60)
Glucose, Bld: 111 mg/dL — ABNORMAL HIGH (ref 70–99)
Potassium: 4.1 mmol/L (ref 3.5–5.1)
Sodium: 139 mmol/L (ref 135–145)
Total Bilirubin: 0.5 mg/dL (ref 0.3–1.2)
Total Protein: 7.3 g/dL (ref 6.5–8.1)

## 2018-05-01 LAB — CBC WITH DIFFERENTIAL/PLATELET
Abs Immature Granulocytes: 0.03 10*3/uL (ref 0.00–0.07)
Basophils Absolute: 0.1 10*3/uL (ref 0.0–0.1)
Basophils Relative: 1 %
Eosinophils Absolute: 0.1 10*3/uL (ref 0.0–0.5)
Eosinophils Relative: 2 %
HCT: 37.9 % (ref 36.0–46.0)
Hemoglobin: 12.9 g/dL (ref 12.0–15.0)
Immature Granulocytes: 0 %
Lymphocytes Relative: 30 %
Lymphs Abs: 2.3 10*3/uL (ref 0.7–4.0)
MCH: 35.1 pg — ABNORMAL HIGH (ref 26.0–34.0)
MCHC: 34 g/dL (ref 30.0–36.0)
MCV: 103 fL — ABNORMAL HIGH (ref 80.0–100.0)
Monocytes Absolute: 0.7 10*3/uL (ref 0.1–1.0)
Monocytes Relative: 9 %
Neutro Abs: 4.5 10*3/uL (ref 1.7–7.7)
Neutrophils Relative %: 58 %
Platelets: 314 10*3/uL (ref 150–400)
RBC: 3.68 MIL/uL — ABNORMAL LOW (ref 3.87–5.11)
RDW: 14.7 % (ref 11.5–15.5)
WBC: 7.6 10*3/uL (ref 4.0–10.5)
nRBC: 0 % (ref 0.0–0.2)

## 2018-06-18 NOTE — Progress Notes (Signed)
[facility] day:  06/19/18  Chief Complaint: Emily Wallace is a 68 y.o. 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  HPI:  The patient was last seen in the hematology clinic on 03/20/2018.  At that time, she was doing well.  Exam was normal.  Platelet count was 342,000.  She was on hydroxyurea 500 mg BID.  CBC on 05/21/2018 revealed a hematocrit of 37.9, hemoglobin 12.9, MCV 103.0, platelets 314,000, and WBC 7600 with an Shorter of 4500.  During the interim, patient is doing well. She feels generally well.  She had a viral GI illness on New Years that resolved without treatment. Patient notes exercise induced myalgias. 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 Patient denies bleeding; no hematochezia, melena, or gross hematuria. She denies any new areas of unexplained bruising.  Patient denies that she has experienced any B symptoms. She denies any interval infections.   She remains on hydroxyurea 500 mg BID since 03/08/2018.  Patient advises that she maintains an adequate appetite. She is eating well. Weight today is 183 lb 5 oz (83.2 kg), which compared to her last visit to the clinic, represents a 2 pound increase.   Patient complains of pain (legs) rated 3/10 in the clinic today.   Past Medical History:  Diagnosis Date  . Anxiety   . GERD (gastroesophageal reflux disease)   . Hypertension   . Joint pain   . Vitamin D deficiency     Past Surgical History:  Procedure Laterality Date  . GALLBLADDER SURGERY      Family History  Problem Relation Age of Onset  . Colon cancer Brother   . Colon cancer Maternal Aunt   . Breast cancer Maternal Aunt   . Colon cancer Paternal Aunt   . Diabetes Maternal Grandmother   . Colon cancer Paternal Grandmother     Social History:  reports that she has never smoked. She has never used smokeless tobacco. No history on file  for alcohol and drug.  Patient smoked "3 cigarettes" the whole time while she was in college. She has rarely drinks alcohol. Patient is a retired Visual merchandiser. Patient denies known exposures to radiation on toxins. She lives in [place].  She has been married for 43 years.  The patient is accompanied by her husband, Emily Wallace, today.  Allergies:  Allergies  Allergen Reactions  . Codone [Hydrocodone] Rash    Current Medications: Current Outpatient Medications  Medication Sig Dispense Refill  . ACAI PO Take by mouth daily. 1000 mg daily    . aspirin EC 81 MG tablet Take 81 mg by mouth daily.    Marland Kitchen b complex vitamins capsule Take 1 capsule by mouth daily.    Marland Kitchen esomeprazole (NEXIUM) 40 MG capsule Take 40 mg by mouth daily at 12 noon.    . famotidine (PEPCID) 40 MG tablet Take 40 mg by mouth daily.    . hydroxyurea (HYDREA) 500 MG capsule Take 2 pills (1000 mg) on M,W,F and 1 pill (500 mg) on T, Th, Sat, Sun. (Patient taking differently: 1,000 mg 2 (two) times daily. ) 120 capsule 1  . losartan (COZAAR) 50 MG tablet Take 50 mg by mouth daily.    . sertraline (ZOLOFT) 100 MG tablet Take 100 mg by mouth daily.    . TURMERIC PO Take by mouth daily. 1500 mg daily    . VITAMIN D, ERGOCALCIFEROL,  PO Take 5,000 Units by mouth daily.     No current facility-administered medications for this visit.     Review of Systems  Constitutional: Negative for diaphoresis, fever, malaise/fatigue and weight loss (up 2 pounds).       Feels well. Active.  HENT: Negative.   Eyes: Negative.   Respiratory: Negative for cough, hemoptysis, sputum production and shortness of breath.   Cardiovascular: Negative for chest pain, palpitations, orthopnea, leg swelling and PND.  Gastrointestinal: Positive for heartburn (on PPI therapy). Negative for abdominal pain, blood in stool, constipation, diarrhea, melena, nausea and vomiting.  Genitourinary: Negative for dysuria, frequency, hematuria and urgency.   Musculoskeletal: Positive for myalgias (exercise induced). Negative for back pain, falls and joint pain.  Skin: Negative for itching and rash.  Neurological: Negative for dizziness, tremors, weakness and headaches.  Endo/Heme/Allergies: Does not bruise/bleed easily.  Psychiatric/Behavioral: Negative for depression, memory loss and suicidal ideas. The patient is not nervous/anxious and does not have insomnia.   All other systems reviewed and are negative.  Performance status (ECOG): 0 - Asymptomatic  Vital Signs BP 113/72 (BP Location: Left Arm, Patient Position: Sitting)   Pulse (!) 101   Resp 16   Wt 183 lb 5 oz (83.2 kg)   SpO2 97%   BMI 27.07 kg/m   Physical Exam  Constitutional: She is oriented to person, place, and time and well-developed, well-nourished, and in no distress. No distress.  HENT:  Head: Normocephalic and atraumatic.  Mouth/Throat: Oropharynx is clear and moist and mucous membranes are normal. No oropharyngeal exudate.  Short dark brown hair.  Eyes: Pupils are equal, round, and reactive to light. EOM are normal. No scleral icterus.  Blue eyes.  Neck: Normal range of motion. Neck supple. No JVD present.  Cardiovascular: Normal rate, regular rhythm, normal heart sounds and intact distal pulses. Exam reveals no gallop and no friction rub.  No murmur heard. Pulmonary/Chest: Effort normal and breath sounds normal. No respiratory distress. She has no wheezes. She has no rales.  Abdominal: Soft. Bowel sounds are normal. She exhibits no distension and no mass. There is no abdominal tenderness. There is no rebound and no guarding.  Musculoskeletal: Normal range of motion.        General: No tenderness or edema.  Lymphadenopathy:    She has no cervical adenopathy.    She has no axillary adenopathy.       Right: No inguinal and no supraclavicular adenopathy present.       Left: No inguinal and no supraclavicular adenopathy present.  Neurological: She is alert and  oriented to person, place, and time.  Skin: Skin is warm and dry. No rash noted. She is not diaphoretic. No erythema.  Psychiatric: Mood, affect and judgment normal.  Nursing note and vitals reviewed.   Appointment on 06/19/2018  Component Date Value Ref Range Status  . Sodium 06/19/2018 135  135 - 145 mmol/L Final  . Potassium 06/19/2018 3.8  3.5 - 5.1 mmol/L Final  . Chloride 06/19/2018 100  98 - 111 mmol/L Final  . CO2 06/19/2018 25  22 - 32 mmol/L Final  . Glucose, Bld 06/19/2018 94  70 - 99 mg/dL Final  . BUN 06/19/2018 18  8 - 23 mg/dL Final  . Creatinine, Ser 06/19/2018 0.90  0.44 - 1.00 mg/dL Final  . Calcium 06/19/2018 9.2  8.9 - 10.3 mg/dL Final  . Total Protein 06/19/2018 7.7  6.5 - 8.1 g/dL Final  . Albumin 06/19/2018 4.1  3.5 -  5.0 g/dL Final  . AST 06/19/2018 17  15 - 41 U/L Final  . ALT 06/19/2018 14  0 - 44 U/L Final  . Alkaline Phosphatase 06/19/2018 71  38 - 126 U/L Final  . Total Bilirubin 06/19/2018 0.8  0.3 - 1.2 mg/dL Final  . GFR calc non Af Amer 06/19/2018 >60  >60 mL/min Final  . GFR calc Af Amer 06/19/2018 >60  >60 mL/min Final  . Anion gap 06/19/2018 10  5 - 15 Final   Performed at [facility], [address] [place], [place] [address]  . WBC 06/19/2018 9.7  4.0 - 10.5 K/uL Final  . RBC 06/19/2018 3.69* 3.87 - 5.11 MIL/uL Final  . Hemoglobin 06/19/2018 13.6  12.0 - 15.0 g/dL Final  . HCT 06/19/2018 38.9  36.0 - 46.0 % Final  . MCV 06/19/2018 105.4* 80.0 - 100.0 fL Final  . MCH 06/19/2018 36.9* 26.0 - 34.0 pg Final  . MCHC 06/19/2018 35.0  30.0 - 36.0 g/dL Final  . RDW 06/19/2018 12.2  11.5 - 15.5 % Final  . Platelets 06/19/2018 326  150 - 400 K/uL Final  . nRBC 06/19/2018 0.0  0.0 - 0.2 % Final  . Neutrophils Relative % 06/19/2018 66  % Final  . Neutro Abs 06/19/2018 6.4  1.7 - 7.7 K/uL Final  . Lymphocytes Relative 06/19/2018 22  % Final  . Lymphs Abs 06/19/2018 2.2  0.7 - 4.0 K/uL Final  . Monocytes Relative 06/19/2018 10  % Final   . Monocytes Absolute 06/19/2018 0.9  0.1 - 1.0 K/uL Final  . Eosinophils Relative 06/19/2018 1  % Final  . Eosinophils Absolute 06/19/2018 0.1  0.0 - 0.5 K/uL Final  . Basophils Relative 06/19/2018 1  % Final  . Basophils Absolute 06/19/2018 0.1  0.0 - 0.1 K/uL Final  . Immature Granulocytes 06/19/2018 0  % Final  . Abs Immature Granulocytes 06/19/2018 0.04  0.00 - 0.07 K/uL Final   Performed at [facility], [address] [place], [place] [address]    Assessment:  LASSIE DEMOREST is a 68 y.o. 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  Platelet count has ranged between 457,000 - 590,000 for the past 4 years.  CBC on 05/05/2017 revealed a hematocrit of 38.5, hemoglobin 13, MCV 86.7, platelets 590,000, white count 10,800 and ANC of 7600.  Differential was unremarkable.  Work-up on 11/04/2017 revealed a hematocrit of 41.5, hemoglobin 14.2, MCV 87, platelets 573,000, WBC 9800 with an ANC of 6500.  Differential was normal.  Ferritin was 31.  Iron studies included a saturation of 25% and TIBC of 293.  Sed rate was 11.  BCR-ABL was negative.  JAK2 V617F was positive.  von Willebrand panel was normal on 12/13/2017.  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   Reticulin and trichrome stains did not show significant increased in reticulin  fibers or collagen deposition.  Cytogenetics were normal (18, XX).   She began hydroxyurea 500 mg/day on 12/13/2017.  Dose was increased to 500 mg BID on MWF and 500 mg daily on T,Th,S,S on 01/09/2018.  Dose was increased to 500 mg BID on 02/06/2018.  She is on a baby aspirin.  EGD on 07/25/2017 for epigastric pain and dysphasia revealed reflux esophagitis, a single gastric polyp (fundic gland polyp) normal duodenum.  H. pylori and Barrett's were negative.  Colonoscopy on 09/22/2017 revealed one small polyp in the  mid transverse colon  (tubular adenoma) and diverticulosis in the sigmoid colon.  She has a family history of colon cancer (twin brother and paternal aunt).  Symptomatically, she is doing well.  She denies any acute concerns.  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  She complains of exercise induced pain in her lower legs rated 3 out of 10 today.  Exam is grossly unremarkable.  WBC 9700 ([place] 6400).  Platelets 326,000.  Plan: 1.   Labs today:  CBC with diff, CMP. 2.   JAK2 (+) MPN (Essential thrombocythemia)  Labs reviewed.   Platelets 326,000.   MCV elevated at 105.4 secondary to hydroxyurea therapy.   Tolerating treatment well with no perceived side effects.   Continue hydroxyurea 500 mg BID.  Continue ASA 81 mg daily.  3.   RTC in 6 weeks for labs (CBC with diff, CMP). 4.   RTC in 3 months for MD assessment and labs (CBC with diff, CMP).   Honor Loh, NP 06/19/18, 3:25 PM   I saw and evaluated the patient, participating in the key portions of the service and reviewing pertinent diagnostic studies and records.  I reviewed the nurse practitioner's note and agree with the findings and the plan.  The assessment and plan were discussed with the patient.  Several questions were asked by the patient and answered.   Nolon Stalls, MD 06/19/2018,3:25 PM

## 2018-06-19 ENCOUNTER — Inpatient Hospital Stay: Payer: Medicare Other | Attending: Hematology and Oncology

## 2018-06-19 ENCOUNTER — Inpatient Hospital Stay (HOSPITAL_BASED_OUTPATIENT_CLINIC_OR_DEPARTMENT_OTHER): Payer: Medicare Other | Admitting: Hematology and Oncology

## 2018-06-19 ENCOUNTER — Ambulatory Visit: Payer: Medicare Other | Admitting: Hematology and Oncology

## 2018-06-19 ENCOUNTER — Encounter: Payer: Self-pay | Admitting: Hematology and Oncology

## 2018-06-19 ENCOUNTER — Other Ambulatory Visit: Payer: Medicare Other

## 2018-06-19 VITALS — BP 113/72 | HR 101 | Resp 16 | Wt 183.3 lb

## 2018-06-19 DIAGNOSIS — D473 Essential (hemorrhagic) thrombocythemia: Secondary | ICD-10-CM

## 2018-06-19 LAB — CBC WITH DIFFERENTIAL/PLATELET
Abs Immature Granulocytes: 0.04 10*3/uL (ref 0.00–0.07)
Basophils Absolute: 0.1 10*3/uL (ref 0.0–0.1)
Basophils Relative: 1 %
Eosinophils Absolute: 0.1 10*3/uL (ref 0.0–0.5)
Eosinophils Relative: 1 %
HCT: 38.9 % (ref 36.0–46.0)
Hemoglobin: 13.6 g/dL (ref 12.0–15.0)
Immature Granulocytes: 0 %
Lymphocytes Relative: 22 %
Lymphs Abs: 2.2 10*3/uL (ref 0.7–4.0)
MCH: 36.9 pg — ABNORMAL HIGH (ref 26.0–34.0)
MCHC: 35 g/dL (ref 30.0–36.0)
MCV: 105.4 fL — ABNORMAL HIGH (ref 80.0–100.0)
Monocytes Absolute: 0.9 10*3/uL (ref 0.1–1.0)
Monocytes Relative: 10 %
Neutro Abs: 6.4 10*3/uL (ref 1.7–7.7)
Neutrophils Relative %: 66 %
Platelets: 326 10*3/uL (ref 150–400)
RBC: 3.69 MIL/uL — ABNORMAL LOW (ref 3.87–5.11)
RDW: 12.2 % (ref 11.5–15.5)
WBC: 9.7 10*3/uL (ref 4.0–10.5)
nRBC: 0 % (ref 0.0–0.2)

## 2018-06-19 LAB — COMPREHENSIVE METABOLIC PANEL
ALT: 14 U/L (ref 0–44)
AST: 17 U/L (ref 15–41)
Albumin: 4.1 g/dL (ref 3.5–5.0)
Alkaline Phosphatase: 71 U/L (ref 38–126)
Anion gap: 10 (ref 5–15)
BUN: 18 mg/dL (ref 8–23)
CO2: 25 mmol/L (ref 22–32)
Calcium: 9.2 mg/dL (ref 8.9–10.3)
Chloride: 100 mmol/L (ref 98–111)
Creatinine, Ser: 0.9 mg/dL (ref 0.44–1.00)
GFR calc Af Amer: >60 mL/min (ref >60)
GFR calc non Af Amer: >60 mL/min (ref >60)
Glucose, Bld: 94 mg/dL (ref 70–99)
Potassium: 3.8 mmol/L (ref 3.5–5.1)
Sodium: 135 mmol/L (ref 135–145)
Total Bilirubin: 0.8 mg/dL (ref 0.3–1.2)
Total Protein: 7.7 g/dL (ref 6.5–8.1)

## 2018-06-19 NOTE — Progress Notes (Signed)
Pt here for follow up. Denies any concerns at this time.  

## 2018-07-31 ENCOUNTER — Inpatient Hospital Stay: Payer: Medicare Other | Attending: Hematology and Oncology

## 2018-07-31 DIAGNOSIS — D473 Essential (hemorrhagic) thrombocythemia: Secondary | ICD-10-CM | POA: Insufficient documentation

## 2018-08-01 ENCOUNTER — Other Ambulatory Visit: Payer: Self-pay

## 2018-08-01 ENCOUNTER — Inpatient Hospital Stay: Payer: Medicare Other

## 2018-08-01 DIAGNOSIS — D473 Essential (hemorrhagic) thrombocythemia: Secondary | ICD-10-CM

## 2018-08-01 LAB — CBC WITH DIFFERENTIAL/PLATELET
Abs Immature Granulocytes: 0.01 10*3/uL (ref 0.00–0.07)
Basophils Absolute: 0 10*3/uL (ref 0.0–0.1)
Basophils Relative: 0 %
Eosinophils Absolute: 0.1 10*3/uL (ref 0.0–0.5)
Eosinophils Relative: 1 %
HCT: 38.2 % (ref 36.0–46.0)
Hemoglobin: 13.4 g/dL (ref 12.0–15.0)
Immature Granulocytes: 0 %
Lymphocytes Relative: 27 %
Lymphs Abs: 2.2 10*3/uL (ref 0.7–4.0)
MCH: 37.4 pg — ABNORMAL HIGH (ref 26.0–34.0)
MCHC: 35.1 g/dL (ref 30.0–36.0)
MCV: 106.7 fL — ABNORMAL HIGH (ref 80.0–100.0)
Monocytes Absolute: 0.8 10*3/uL (ref 0.1–1.0)
Monocytes Relative: 10 %
Neutro Abs: 5 10*3/uL (ref 1.7–7.7)
Neutrophils Relative %: 62 %
Platelets: 307 10*3/uL (ref 150–400)
RBC: 3.58 MIL/uL — ABNORMAL LOW (ref 3.87–5.11)
RDW: 12.5 % (ref 11.5–15.5)
WBC: 8.1 10*3/uL (ref 4.0–10.5)
nRBC: 0 % (ref 0.0–0.2)

## 2018-08-01 LAB — COMPREHENSIVE METABOLIC PANEL
ALT: 15 U/L (ref 0–44)
AST: 19 U/L (ref 15–41)
Albumin: 3.9 g/dL (ref 3.5–5.0)
Alkaline Phosphatase: 74 U/L (ref 38–126)
Anion gap: 8 (ref 5–15)
BUN: 20 mg/dL (ref 8–23)
CO2: 25 mmol/L (ref 22–32)
Calcium: 9.1 mg/dL (ref 8.9–10.3)
Chloride: 104 mmol/L (ref 98–111)
Creatinine, Ser: 0.77 mg/dL (ref 0.44–1.00)
GFR calc Af Amer: >60 mL/min (ref >60)
GFR calc non Af Amer: >60 mL/min (ref >60)
Glucose, Bld: 95 mg/dL (ref 70–99)
Potassium: 3.7 mmol/L (ref 3.5–5.1)
Sodium: 137 mmol/L (ref 135–145)
Total Bilirubin: 0.8 mg/dL (ref 0.3–1.2)
Total Protein: 7.4 g/dL (ref 6.5–8.1)

## 2018-09-18 ENCOUNTER — Ambulatory Visit: Payer: Medicare Other | Admitting: Hematology and Oncology

## 2018-09-18 ENCOUNTER — Other Ambulatory Visit: Payer: Medicare Other

## 2018-10-10 ENCOUNTER — Encounter: Payer: Self-pay | Admitting: Hematology and Oncology

## 2018-10-15 ENCOUNTER — Encounter: Payer: Self-pay | Admitting: Hematology and Oncology

## 2018-10-17 ENCOUNTER — Ambulatory Visit: Payer: Medicare Other

## 2018-10-17 ENCOUNTER — Telehealth: Payer: Self-pay | Admitting: Hematology and Oncology

## 2018-10-17 ENCOUNTER — Inpatient Hospital Stay: Payer: Medicare Other | Attending: Hematology and Oncology

## 2018-10-17 ENCOUNTER — Telehealth: Payer: Self-pay

## 2018-10-17 ENCOUNTER — Other Ambulatory Visit: Payer: Self-pay

## 2018-10-17 ENCOUNTER — Other Ambulatory Visit: Payer: Medicare Other

## 2018-10-17 DIAGNOSIS — I10 Essential (primary) hypertension: Secondary | ICD-10-CM | POA: Insufficient documentation

## 2018-10-17 DIAGNOSIS — Z79899 Other long term (current) drug therapy: Secondary | ICD-10-CM | POA: Diagnosis not present

## 2018-10-17 DIAGNOSIS — Z803 Family history of malignant neoplasm of breast: Secondary | ICD-10-CM | POA: Insufficient documentation

## 2018-10-17 DIAGNOSIS — F17210 Nicotine dependence, cigarettes, uncomplicated: Secondary | ICD-10-CM | POA: Diagnosis not present

## 2018-10-17 DIAGNOSIS — Z7982 Long term (current) use of aspirin: Secondary | ICD-10-CM | POA: Insufficient documentation

## 2018-10-17 DIAGNOSIS — F419 Anxiety disorder, unspecified: Secondary | ICD-10-CM | POA: Diagnosis not present

## 2018-10-17 DIAGNOSIS — E559 Vitamin D deficiency, unspecified: Secondary | ICD-10-CM | POA: Insufficient documentation

## 2018-10-17 DIAGNOSIS — D473 Essential (hemorrhagic) thrombocythemia: Secondary | ICD-10-CM | POA: Diagnosis present

## 2018-10-17 LAB — COMPREHENSIVE METABOLIC PANEL
ALT: 18 U/L (ref 0–44)
AST: 21 U/L (ref 15–41)
Albumin: 4.1 g/dL (ref 3.5–5.0)
Alkaline Phosphatase: 69 U/L (ref 38–126)
Anion gap: 10 (ref 5–15)
BUN: 25 mg/dL — ABNORMAL HIGH (ref 8–23)
CO2: 26 mmol/L (ref 22–32)
Calcium: 9 mg/dL (ref 8.9–10.3)
Chloride: 100 mmol/L (ref 98–111)
Creatinine, Ser: 0.93 mg/dL (ref 0.44–1.00)
GFR calc Af Amer: >60 mL/min (ref >60)
GFR calc non Af Amer: >60 mL/min (ref >60)
Glucose, Bld: 101 mg/dL — ABNORMAL HIGH (ref 70–99)
Potassium: 3.7 mmol/L (ref 3.5–5.1)
Sodium: 136 mmol/L (ref 135–145)
Total Bilirubin: 0.9 mg/dL (ref 0.3–1.2)
Total Protein: 7.5 g/dL (ref 6.5–8.1)

## 2018-10-17 LAB — CBC WITH DIFFERENTIAL/PLATELET
Abs Immature Granulocytes: 0.02 10*3/uL (ref 0.00–0.07)
Basophils Absolute: 0.1 10*3/uL (ref 0.0–0.1)
Basophils Relative: 1 %
Eosinophils Absolute: 0.1 10*3/uL (ref 0.0–0.5)
Eosinophils Relative: 1 %
HCT: 39.7 % (ref 36.0–46.0)
Hemoglobin: 13.7 g/dL (ref 12.0–15.0)
Immature Granulocytes: 0 %
Lymphocytes Relative: 23 %
Lymphs Abs: 1.7 10*3/uL (ref 0.7–4.0)
MCH: 36.3 pg — ABNORMAL HIGH (ref 26.0–34.0)
MCHC: 34.5 g/dL (ref 30.0–36.0)
MCV: 105.3 fL — ABNORMAL HIGH (ref 80.0–100.0)
Monocytes Absolute: 0.8 10*3/uL (ref 0.1–1.0)
Monocytes Relative: 10 %
Neutro Abs: 4.9 10*3/uL (ref 1.7–7.7)
Neutrophils Relative %: 65 %
Platelets: 299 10*3/uL (ref 150–400)
RBC: 3.77 MIL/uL — ABNORMAL LOW (ref 3.87–5.11)
RDW: 12 % (ref 11.5–15.5)
WBC: 7.5 10*3/uL (ref 4.0–10.5)
nRBC: 0 % (ref 0.0–0.2)

## 2018-10-17 NOTE — Progress Notes (Signed)
[facility]  [address], [address] [place], [place] [address] Phone: [phone]  Fax: [phone]   Telemedicine Office Visit:  10/18/2018  Referring physician: Maryland Pink, MD  I connected with Collie Siad Fritzsche on 10/18/2018 at 12:21 PM by videoconferencing and verified that I was speaking with the correct person using 2 identifiers.  The patient was at home.  I discussed the limitations, risk, security and privacy concerns of performing an evaluation and management service by videoconferencing and the availability of in person appointments.  I also discussed with the patient that there may be a patient responsible charge related to this service.  The patient expressed understanding and agreed to proceed.   Chief Complaint: Emily Wallace is a 68 y.o. 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  HPI: The patient was last seen in the hematology clinic on 06/19/2018. At that time, she was doing well.  She denied any acute concerns.  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  She complained of exercise induced pain in her lower legs rated 3 out of 10 today.  Exam was grossly unremarkable.  WBC 9700 ([place] 6400). Platelets 326,000. She continued on hydroxyurea 500 mg BID and ASA 81mg  daily.   She contacted the clinic on 10/15/2018 about concerns over traveling to see her newly discovered half sister with her brother who has cancer and less than a year to live.   During the interim, the patient is doing well. She notes joint pain in her knees. She denies any bruising or bleeding. She reports seasonal allergies.   She reports she has been eating more and gaining weight. Her heartburn is improved with Protonix.  She jogs on her trampoline, walks, and bikes. 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    Past Medical History:  Diagnosis Date  . Anxiety   . GERD  (gastroesophageal reflux disease)   . Hypertension   . Joint pain   . Vitamin D deficiency     Past Surgical History:  Procedure Laterality Date  . GALLBLADDER SURGERY      Family History  Problem Relation Age of Onset  . Colon cancer Brother   . Colon cancer Maternal Aunt   . Breast cancer Maternal Aunt   . Colon cancer Paternal Aunt   . Diabetes Maternal Grandmother   . Colon cancer Paternal Grandmother     Social History:  reports that she has never smoked. She has never used smokeless tobacco. No history on file for alcohol and drug. Patient smoked "3 cigarettes" the whole time while she was in college. She has rarely drinks alcohol. Patient is a retired Visual merchandiser. Patient denies known exposures to radiation on toxins. She lives in [place].  She has been married for 43 years.  The patient is alone today.   Participants in the patient's visit and their role in the encounter included the patient and Vito Berger, CMA, today.  The intake visit was provided by Vito Berger, CMA.  Allergies:  Allergies  Allergen Reactions  . Codone [Hydrocodone] Rash    Current Medications: Current Outpatient Medications  Medication Sig Dispense Refill  . ACAI PO Take by mouth daily. 1000 mg daily    . aspirin EC 81 MG tablet Take 81 mg by mouth daily.    Marland Kitchen b complex vitamins capsule Take 1 capsule by mouth daily.    Marland Kitchen esomeprazole (NEXIUM) 40 MG  capsule Take 40 mg by mouth daily at 12 noon.    . famotidine (PEPCID) 40 MG tablet Take 40 mg by mouth daily.    . hydroxyurea (HYDREA) 500 MG capsule Take 2 pills (1000 mg) on M,W,F and 1 pill (500 mg) on T, Th, Sat, Sun. (Patient taking differently: 500 mg 2 (two) times daily. ) 120 capsule 1  . losartan (COZAAR) 50 MG tablet Take 50 mg by mouth daily.    . sertraline (ZOLOFT) 100 MG tablet Take 100 mg by mouth daily.    . TURMERIC PO Take by mouth daily. 1500 mg daily    . VITAMIN D, ERGOCALCIFEROL, PO Take 5,000  Units by mouth daily.     No current facility-administered medications for this visit.     Review of Systems  Constitutional: Negative.  Negative for chills, diaphoresis, fever, malaise/fatigue and weight loss (weight gain).       Feels well. Active.  HENT: Negative.  Negative for congestion, hearing loss, nosebleeds, sinus pain and sore throat.   Eyes: Negative.  Negative for blurred vision.  Respiratory: Negative.  Negative for cough, hemoptysis, sputum production and shortness of breath.   Cardiovascular: Negative.  Negative for chest pain, palpitations, orthopnea, leg swelling and PND.  Gastrointestinal: Positive for heartburn (on PPI therapy; depends on what she eats). Negative for abdominal pain, blood in stool, constipation, diarrhea, melena, nausea and vomiting.  Genitourinary: Negative for dysuria, frequency, hematuria and urgency.  Musculoskeletal: Positive for joint pain (knees) and myalgias (exercise induced). Negative for back pain and falls.  Skin: Negative.  Negative for itching and rash.  Neurological: Negative.  Negative for dizziness, tremors, sensory change, weakness and headaches.  Endo/Heme/Allergies: Positive for environmental allergies. Does not bruise/bleed easily.  Psychiatric/Behavioral: Negative.  Negative for depression, memory loss and suicidal ideas. The patient is not nervous/anxious and does not have insomnia.   All other systems reviewed and are negative.   Performance status (ECOG):  0  Physical Exam  Constitutional: She is oriented to person, place, and time. She appears well-developed and well-nourished. No distress.  HENT:  Head: Normocephalic and atraumatic.  Short brown hair.  Eyes: Conjunctivae and EOM are normal. No scleral icterus.  Blue eyes.  Neurological: She is alert and oriented to person, place, and time.  Skin: She is not diaphoretic.  Psychiatric: She has a normal mood and affect. Her behavior is normal. Judgment and thought content  normal.  Nursing note reviewed.   Appointment on 10/17/2018  Component Date Value Ref Range Status  . Sodium 10/17/2018 136  135 - 145 mmol/L Final  . Potassium 10/17/2018 3.7  3.5 - 5.1 mmol/L Final  . Chloride 10/17/2018 100  98 - 111 mmol/L Final  . CO2 10/17/2018 26  22 - 32 mmol/L Final  . Glucose, Bld 10/17/2018 101* 70 - 99 mg/dL Final  . BUN 10/17/2018 25* 8 - 23 mg/dL Final  . Creatinine, Ser 10/17/2018 0.93  0.44 - 1.00 mg/dL Final  . Calcium 10/17/2018 9.0  8.9 - 10.3 mg/dL Final  . Total Protein 10/17/2018 7.5  6.5 - 8.1 g/dL Final  . Albumin 10/17/2018 4.1  3.5 - 5.0 g/dL Final  . AST 10/17/2018 21  15 - 41 U/L Final  . ALT 10/17/2018 18  0 - 44 U/L Final  . Alkaline Phosphatase 10/17/2018 69  38 - 126 U/L Final  . Total Bilirubin 10/17/2018 0.9  0.3 - 1.2 mg/dL Final  . GFR calc non Af Amer 10/17/2018 >60  >  60 mL/min Final  . GFR calc Af Amer 10/17/2018 >60  >60 mL/min Final  . Anion gap 10/17/2018 10  5 - 15 Final   Performed at [facility], [address], [place], [place] [address]  . WBC 10/17/2018 7.5  4.0 - 10.5 K/uL Final  . RBC 10/17/2018 3.77* 3.87 - 5.11 MIL/uL Final  . Hemoglobin 10/17/2018 13.7  12.0 - 15.0 g/dL Final  . HCT 10/17/2018 39.7  36.0 - 46.0 % Final  . MCV 10/17/2018 105.3* 80.0 - 100.0 fL Final  . MCH 10/17/2018 36.3* 26.0 - 34.0 pg Final  . MCHC 10/17/2018 34.5  30.0 - 36.0 g/dL Final  . RDW 10/17/2018 12.0  11.5 - 15.5 % Final  . Platelets 10/17/2018 299  150 - 400 K/uL Final  . nRBC 10/17/2018 0.0  0.0 - 0.2 % Final  . Neutrophils Relative % 10/17/2018 65  % Final  . Neutro Abs 10/17/2018 4.9  1.7 - 7.7 K/uL Final  . Lymphocytes Relative 10/17/2018 23  % Final  . Lymphs Abs 10/17/2018 1.7  0.7 - 4.0 K/uL Final  . Monocytes Relative 10/17/2018 10  % Final  . Monocytes Absolute 10/17/2018 0.8  0.1 - 1.0 K/uL Final  . Eosinophils Relative 10/17/2018 1  % Final  . Eosinophils Absolute 10/17/2018 0.1  0.0 - 0.5 K/uL Final  .  Basophils Relative 10/17/2018 1  % Final  . Basophils Absolute 10/17/2018 0.1  0.0 - 0.1 K/uL Final  . Immature Granulocytes 10/17/2018 0  % Final  . Abs Immature Granulocytes 10/17/2018 0.02  0.00 - 0.07 K/uL Final   Performed at [facility], [address], [place], [place] [address]    Assessment:  Emily Wallace is a 68 y.o. 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  Platelet count has ranged between 457,000 - 590,000 for the past 4 years.  CBC on 05/05/2017 revealed a hematocrit of 38.5, hemoglobin 13, MCV 86.7, platelets 590,000, white count 10,800 and ANC of 7600.  Differential was unremarkable.  Work-up on 11/04/2017 revealed a hematocrit of 41.5, hemoglobin 14.2, MCV 87, platelets 573,000, WBC 9800 with an ANC of 6500.  Differential was normal.  Ferritin was 31.  Iron studies included a saturation of 25% and TIBC of 293.  Sed rate was 11.  BCR-ABL was negative.  JAK2 V617F was positive.  von Willebrand panel was normal on 12/13/2017.  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   Reticulin and trichrome stains did not show significant increased in reticulin  fibers or collagen deposition.  Cytogenetics were normal (33, XX).   She began hydroxyurea 500 mg/day on 12/13/2017.  Dose was increased to 500 mg BID on MWF and 500 mg daily on T,Th,S,S on 01/09/2018.  Dose was increased to 500 mg BID on 02/06/2018.  She is on a baby aspirin.  EGD on 07/25/2017 for epigastric pain and dysphasia revealed reflux esophagitis, a single gastric polyp (fundic gland polyp) normal duodenum.  H. pylori and Barrett's were negative.  Colonoscopy on 09/22/2017 revealed one small polyp in the mid transverse colon (tubular adenoma) and diverticulosis in the sigmoid colon.  She has a family history of colon cancer (twin brother and paternal aunt).  Symptomatically, she is doing  well.  Hemoglobin is 13.7.  Platelets are 299,000.  WBC is 7500 ([place] 6400).  Plan: 1.   Review labs from 10/17/2018. 2.   JAK2 (+)  MPN (Essential thrombocythemia)  Platelets 299,000.  Platelet goal < 400,000.  MCV remains elevated at 105.3 secondary to hydroxyurea therapy.    Check B12 and folate annually. Continue hydroxyurea 500 mg BID. Continue ASA 81 mg daily.  3.   RTC in 3 months for MD assessment and labs (CBC with diff, CMP).  I discussed the assessment and treatment plan with the patient.  The patient was provided an opportunity to ask questions and all were answered.  The patient agreed with the plan and demonstrated an understanding of the instructions.  The patient was advised to call back or seek an in person evaluation if the symptoms worsen or if the condition fails to improve as anticipated.  I provided 17 minutes (12:21 PM - 12:38 PM) of face-to-face video visit time during this this encounter and > 50% was spent counseling as documented under my assessment and plan.  I provided these services from the [facility] office.   Nolon Stalls, MD, PhD  10/18/2018, 12:21 PM  I, Molly Dorshimer, am acting as Education administrator for [employer]. Mike Gip, MD, PhD.  I, Melissa C. Mike Gip, MD, have reviewed the above documentation for accuracy and completeness, and I agree with the above.

## 2018-10-17 NOTE — Telephone Encounter (Signed)
Informed patient she is at risk d/t PMH and Oral Chemo. Advised her that we cannot give a timeframe that is safe for her to travel and that this will have to be to her discretion. Patient verbalizes understanding.

## 2018-10-18 ENCOUNTER — Encounter: Payer: Self-pay | Admitting: Hematology and Oncology

## 2018-10-18 ENCOUNTER — Inpatient Hospital Stay (HOSPITAL_BASED_OUTPATIENT_CLINIC_OR_DEPARTMENT_OTHER): Payer: Medicare Other | Admitting: Hematology and Oncology

## 2018-10-18 ENCOUNTER — Other Ambulatory Visit: Payer: Medicare Other

## 2018-10-18 DIAGNOSIS — D473 Essential (hemorrhagic) thrombocythemia: Secondary | ICD-10-CM | POA: Diagnosis not present

## 2018-10-18 DIAGNOSIS — Z79899 Other long term (current) drug therapy: Secondary | ICD-10-CM

## 2018-10-18 DIAGNOSIS — Z7982 Long term (current) use of aspirin: Secondary | ICD-10-CM

## 2018-10-18 DIAGNOSIS — D7589 Other specified diseases of blood and blood-forming organs: Secondary | ICD-10-CM

## 2018-10-18 DIAGNOSIS — I10 Essential (primary) hypertension: Secondary | ICD-10-CM | POA: Diagnosis not present

## 2018-10-18 NOTE — Progress Notes (Signed)
No new changes noted today. The patient name,DOB and address has been verified by phone today. 

## 2018-11-02 ENCOUNTER — Other Ambulatory Visit: Payer: Self-pay | Admitting: Hematology and Oncology

## 2018-11-20 ENCOUNTER — Encounter: Payer: Self-pay | Admitting: Hematology and Oncology

## 2019-01-01 ENCOUNTER — Other Ambulatory Visit: Payer: Self-pay | Admitting: Hematology and Oncology

## 2019-01-02 NOTE — Telephone Encounter (Signed)
Contains abnormal data CBC with Differential Order: [id number] Status:  Final result Visible to patient:  Yes (MyChart) Next appt:  01/18/2019 at 08:45 AM in Oncology ([facility]) Dx:  Essential thrombocytosis ([place])  Ref Range & Units 53mo ago 35mo ago 28mo ago  WBC 4.0 - 10.5 K/uL 7.5  8.1  9.7   RBC 3.87 - 5.11 MIL/uL 3.77Low   3.58Low   3.69Low    Hemoglobin 12.0 - 15.0 g/dL 13.7  13.4  13.6   HCT 36.0 - 46.0 % 39.7  38.2  38.9   MCV 80.0 - 100.0 fL 105.3High   106.7High   105.4High    MCH 26.0 - 34.0 pg 36.3High   37.4High   36.9High    MCHC 30.0 - 36.0 g/dL 34.5  35.1  35.0   RDW 11.5 - 15.5 % 12.0  12.5  12.2   Platelets 150 - 400 K/uL 299  307  326   nRBC 0.0 - 0.2 % 0.0  0.0  0.0   Neutrophils Relative % % 65  62  66   Neutro Abs 1.7 - 7.7 K/uL 4.9  5.0  6.4   Lymphocytes Relative % 23  27  22    Lymphs Abs 0.7 - 4.0 K/uL 1.7  2.2  2.2   Monocytes Relative % 10  10  10    Monocytes Absolute 0.1 - 1.0 K/uL 0.8  0.8  0.9   Eosinophils Relative % 1  1  1    Eosinophils Absolute 0.0 - 0.5 K/uL 0.1  0.1  0.1   Basophils Relative % 1  0  1   Basophils Absolute 0.0 - 0.1 K/uL 0.1  0.0  0.1   Immature Granulocytes % 0  0  0   Abs Immature Granulocytes 0.00 - 0.07 K/uL 0.02  0.01 CM  0.04 CM   Comment: Performed at [facility]gent [facility], [address], [place], [place] [address]  Resulting Agency  [facility] CLIN LAB [facility] CLIN LAB [facility] CLIN LAB      Specimen Collected: 10/17/18 11:09 Last Resulted: 10/17/18 11:19     Lab Flowsheet   Order Details   View Encounter   Lab and Collection Details   Routing   Result History     CM=Additional comments      Other Results from 10/17/2018  Contains abnormal data Comprehensive metabolic panel Order: [id number]  Status:  Final result Visible to patient:  Yes (MyChart) Next appt:  01/18/2019 at 08:45 AM in Oncology ([facility]) Dx:  Essential thrombocytosis ([place])  Ref Range & Units 82mo ago 97mo ago 89mo ago  Sodium 135 - 145 mmol/L  136  137  135   Potassium 3.5 - 5.1 mmol/L 3.7  3.7  3.8   Chloride 98 - 111 mmol/L 100  104  100   CO2 22 - 32 mmol/L 26  25  25    Glucose, Bld 70 - 99 mg/dL 101High   95  94   BUN 8 - 23 mg/dL 25High   20  18   Creatinine, Ser 0.44 - 1.00 mg/dL 0.93  0.77  0.90   Calcium 8.9 - 10.3 mg/dL 9.0  9.1  9.2   Total Protein 6.5 - 8.1 g/dL 7.5  7.4  7.7   Albumin 3.5 - 5.0 g/dL 4.1  3.9  4.1   AST 15 - 41 U/L 21  19  17    ALT 0 - 44 U/L 18  15  14    Alkaline Phosphatase 38 -  126 U/L 69  74  71   Total Bilirubin 0.3 - 1.2 mg/dL 0.9  0.8  0.8   GFR calc non Af Amer >60 mL/min >60  >60  >60   GFR calc Af Amer >60 mL/min >60  >60  >60   Anion gap 5 - 15 10  8  CM  10 CM   Comment: Performed at [facility], [address], [place], [place] [address]  Resulting Agency  [facility] CLIN LAB [facility] CLIN LAB [facility] CLIN LAB      Specimen Collected: 10/17/18 11:09 Last Resulted: 10/17/18 11:34

## 2019-01-16 NOTE — Progress Notes (Signed)
[facility]  [address] [place], [place] [address] Phone: [phone]  Fax: [phone]   Clinic Day:  01/18/2019  Referring physician: Maryland Pink, MD  Chief Complaint: Emily Wallace is a 68 y.o. 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  HPI: The patient was last seen in the hematology clinic on 10/18/2018. At that time, she was doing well.  Hemoglobin was 13.7. MCV was 105.3 secondary to hydroxyurea. Platelets were 299,000 (goal <400,000). WBC was 7500 ([place] 6400). She continued hydroxyurea 500 mg BID.   During the interim, the patient states "I'm here". She notes weight gain, and cooks a lot more. Her weight is up 15 pounds. She is now active lifting weights 3 times a week, and participating in water aerobics.   She continues 500 mg hydroxyurea BID.   Past Medical History:  Diagnosis Date  . Anxiety   . GERD (gastroesophageal reflux disease)   . Hypertension   . Joint pain   . Vitamin D deficiency     Past Surgical History:  Procedure Laterality Date  . GALLBLADDER SURGERY      Family History  Problem Relation Age of Onset  . Colon cancer Brother   . Colon cancer Maternal Aunt   . Breast cancer Maternal Aunt   . Colon cancer Paternal Aunt   . Diabetes Maternal Grandmother   . Colon cancer Paternal Grandmother     Social History:  reports that she has never smoked. She has never used smokeless tobacco. No history on file for alcohol and drug.  Patient smoked "3 cigarettes" the whole time while she was in college. She has rarely drinks alcohol. Patient is a retired Visual merchandiser. Patient denies known exposures to radiation on toxins. She lives in [place]. She has been married for 43 years. She has does water aerobics, and lifts weights. She cooks more. The patient is alone today.  Allergies:  Allergies  Allergen Reactions  . Codone [Hydrocodone] Rash     Current Medications: Current Outpatient Medications  Medication Sig Dispense Refill  . ACAI PO Take by mouth daily. 1000 mg daily    . aspirin EC 81 MG tablet Take 81 mg by mouth daily.    Marland Kitchen b complex vitamins capsule Take 1 capsule by mouth daily.    Marland Kitchen esomeprazole (NEXIUM) 40 MG capsule Take 40 mg by mouth daily at 12 noon.    . famotidine (PEPCID) 40 MG tablet Take 40 mg by mouth daily.    . hydroxyurea (HYDREA) 500 MG capsule TAKE 1 CAPSULE(500 MG) BY MOUTH TWICE DAILY. MAY TAKE WITH FOOD TO MINIMIZE GI SIDE EFFECTS 60 capsule 1  . losartan (COZAAR) 50 MG tablet Take 50 mg by mouth daily.    . sertraline (ZOLOFT) 100 MG tablet Take 100 mg by mouth daily.    . TURMERIC PO Take by mouth daily. 1500 mg daily    . VITAMIN D, ERGOCALCIFEROL, PO Take 5,000 Units by mouth daily.     No current facility-administered medications for this visit.     Review of Systems  Constitutional: Negative.  Negative for chills, diaphoresis, fever, malaise/fatigue and weight loss (up 15 pounds since 06/19/2018).       "I'm here".  HENT: Negative.  Negative for congestion, hearing loss, nosebleeds, sinus pain and sore throat.   Eyes: Negative.  Negative for blurred vision.  Respiratory: Negative.  Negative for cough, hemoptysis, sputum production and shortness of  breath.   Cardiovascular: Negative.  Negative for chest pain, palpitations, orthopnea, leg swelling and PND.  Gastrointestinal: Positive for heartburn (on PPI therapy; depends on what she eats). Negative for abdominal pain, blood in stool, constipation, diarrhea, melena, nausea and vomiting.  Genitourinary: Negative.  Negative for dysuria, frequency, hematuria and urgency.  Musculoskeletal: Positive for joint pain (knees) and myalgias (exercise induced). Negative for back pain and falls.  Skin: Negative.  Negative for itching and rash.  Neurological: Negative.  Negative for dizziness, tremors, sensory change, speech change, focal weakness, weakness  and headaches.  Endo/Heme/Allergies: Positive for environmental allergies. Does not bruise/bleed easily.  Psychiatric/Behavioral: Negative.  Negative for depression and memory loss. The patient is not nervous/anxious and does not have insomnia.   All other systems reviewed and are negative.  Performance status (ECOG): 0  Vitals Blood pressure 113/73, pulse 86, temperature 97.8 F (36.6 C), temperature source Tympanic, resp. rate 18, weight 198 lb 11.9 oz (90.2 kg), SpO2 100 %.  Physical Exam  Constitutional: She is oriented to person, place, and time. She appears well-developed and well-nourished. No distress.  HENT:  Head: Normocephalic and atraumatic.  Mouth/Throat: No oropharyngeal exudate.  Short brown graying hair.  Mask.  Eyes: Pupils are equal, round, and reactive to light. Conjunctivae and EOM are normal. No scleral icterus.  Green eyes.  Neck: Normal range of motion. Neck supple. No JVD present.  Cardiovascular: Normal rate, regular rhythm and normal heart sounds.  No murmur heard. Pulmonary/Chest: Effort normal and breath sounds normal. No respiratory distress. She has no wheezes. She has no rales. She exhibits no tenderness.  Abdominal: Soft. Bowel sounds are normal. She exhibits no mass. There is no abdominal tenderness. There is no rebound and no guarding.  Musculoskeletal: Normal range of motion.        General: No tenderness or edema.  Lymphadenopathy:    She has no cervical adenopathy.    She has no axillary adenopathy.       Right: No supraclavicular adenopathy present.       Left: No supraclavicular adenopathy present.  Neurological: She is alert and oriented to person, place, and time.  Skin: Skin is warm and dry. No rash noted. She is not diaphoretic. No erythema. No pallor.  Psychiatric: She has a normal mood and affect. Her behavior is normal. Judgment and thought content normal.  Nursing note and vitals reviewed.   Appointment on 01/18/2019  Component Date  Value Ref Range Status  . Sodium 01/18/2019 136  135 - 145 mmol/L Final  . Potassium 01/18/2019 4.3  3.5 - 5.1 mmol/L Final  . Chloride 01/18/2019 101  98 - 111 mmol/L Final  . CO2 01/18/2019 25  22 - 32 mmol/L Final  . Glucose, Bld 01/18/2019 106* 70 - 99 mg/dL Final  . BUN 01/18/2019 25* 8 - 23 mg/dL Final  . Creatinine, Ser 01/18/2019 0.88  0.44 - 1.00 mg/dL Final  . Calcium 01/18/2019 9.5  8.9 - 10.3 mg/dL Final  . Total Protein 01/18/2019 7.6  6.5 - 8.1 g/dL Final  . Albumin 01/18/2019 4.2  3.5 - 5.0 g/dL Final  . AST 01/18/2019 18  15 - 41 U/L Final  . ALT 01/18/2019 16  0 - 44 U/L Final  . Alkaline Phosphatase 01/18/2019 70  38 - 126 U/L Final  . Total Bilirubin 01/18/2019 0.4  0.3 - 1.2 mg/dL Final  . GFR calc non Af Amer 01/18/2019 >60  >60 mL/min Final  . GFR calc Af Wyvonnia Lora  01/18/2019 >60  >60 mL/min Final  . Anion gap 01/18/2019 10  5 - 15 Final   Performed at [facility], [address], [place], [place] [address]  . WBC 01/18/2019 7.6  4.0 - 10.5 K/uL Final  . RBC 01/18/2019 3.92  3.87 - 5.11 MIL/uL Final  . Hemoglobin 01/18/2019 14.0  12.0 - 15.0 g/dL Final  . HCT 01/18/2019 40.5  36.0 - 46.0 % Final  . MCV 01/18/2019 103.3* 80.0 - 100.0 fL Final  . MCH 01/18/2019 35.7* 26.0 - 34.0 pg Final  . MCHC 01/18/2019 34.6  30.0 - 36.0 g/dL Final  . RDW 01/18/2019 11.9  11.5 - 15.5 % Final  . Platelets 01/18/2019 304  150 - 400 K/uL Final  . nRBC 01/18/2019 0.0  0.0 - 0.2 % Final  . Neutrophils Relative % 01/18/2019 57  % Final  . Neutro Abs 01/18/2019 4.4  1.7 - 7.7 K/uL Final  . Lymphocytes Relative 01/18/2019 30  % Final  . Lymphs Abs 01/18/2019 2.3  0.7 - 4.0 K/uL Final  . Monocytes Relative 01/18/2019 10  % Final  . Monocytes Absolute 01/18/2019 0.8  0.1 - 1.0 K/uL Final  . Eosinophils Relative 01/18/2019 2  % Final  . Eosinophils Absolute 01/18/2019 0.1  0.0 - 0.5 K/uL Final  . Basophils Relative 01/18/2019 1  % Final  . Basophils Absolute 01/18/2019 0.1   0.0 - 0.1 K/uL Final  . Immature Granulocytes 01/18/2019 0  % Final  . Abs Immature Granulocytes 01/18/2019 0.02  0.00 - 0.07 K/uL Final   Performed at [facility], [address], [place], [place] [address]    Assessment:  Emily Wallace is a 68 y.o. female withessential thrombocytosis (ET). Platelet counthas ranged between 24,000 - 590,000 for the past 4 years.  CBC on 12/13/2018revealed a hematocrit of 38.5, hemoglobin 13, MCV 86.7, platelets 590,000, white count 10,800 and ANC of 7600. Differential was unremarkable.  Work-up on 11/04/2017 revealed a hematocrit of 41.5, hemoglobin 14.2, MCV 87, platelets 573,000, WBC 9800 with an ANC of 6500. Differential was normal. Ferritin was 31. Iron studies included a saturation of 25% and TIBC of 293. Sed rate was 11. BCR-ABL was negative. JAK2 V617Fwas positive. von Willebrand panel was normal on 12/13/2017.  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 Reticulin and trichrome stains did not show significant increased in reticulin fibers or collagen deposition. Cytogenetics were normal (46, XX).   She began hydroxyurea500 mg/dayon 12/13/2017. Dose wasincreased to 500 mg BID on MWF and 500 mg daily on T,Th,S,S on 01/09/2018.Dose was increased to 500 mg BID on 02/06/2018.She is on a baby aspirin.  EGDon 07/25/2017 for epigastric pain and dysphasia revealed reflux esophagitis, a single gastric polyp (fundic gland polyp) normal duodenum. H. pylori and Barrett's were negative. Colonoscopyon 09/22/2017 revealed one small polyp in the mid transverse colon (tubular adenoma) and diverticulosis in the sigmoid colon.  She has afamily history of colon cancer(twin brother and paternal aunt).  Symptomatically, she is doing well.  Exam is stable.  Plan: 1.  Labs today:  CBC with diff, CMP, B12, folate.  2.JAK2 (+) MPN (Essential thrombocythemia)             Platelets 304,000. Platelet goal < 400,000.             MCV remains elevated at 103.3 secondary to hydroxyurea therapy.  Folate was 18.2 and B12  was 713 today. Continue hydroxyurea 500 mg BID.  Continue aspirin 81 mg a day.   3.   RTC in 3 months for MD assessment and labs (CBC with diff, CMP).  I discussed the assessment and treatment plan with the patient.  The patient was provided an opportunity to ask questions and all were answered.  The patient agreed with the plan and demonstrated an understanding of the instructions.  The patient was advised to call back if the symptoms worsen or if the condition fails to improve as anticipated.  I provided 21 minutes of face-to-face time during this this encounter and > 50% was spent counseling as documented under my assessment and plan.    Lequita Asal, MD, PhD    01/18/2019, 9:43 AM  I, Selena Batten, am acting as scribe for [employer]. Mike Gip, MD, PhD.  I, Melissa C. Mike Gip, MD, have reviewed the above documentation for accuracy and completeness, and I agree with the above.

## 2019-01-18 ENCOUNTER — Encounter: Payer: Self-pay | Admitting: Hematology and Oncology

## 2019-01-18 ENCOUNTER — Other Ambulatory Visit: Payer: Self-pay

## 2019-01-18 ENCOUNTER — Inpatient Hospital Stay: Payer: Medicare Other

## 2019-01-18 ENCOUNTER — Inpatient Hospital Stay: Payer: Medicare Other | Attending: Hematology and Oncology | Admitting: Hematology and Oncology

## 2019-01-18 VITALS — BP 113/73 | HR 86 | Temp 97.8°F | Resp 18 | Wt 198.7 lb

## 2019-01-18 DIAGNOSIS — Z7982 Long term (current) use of aspirin: Secondary | ICD-10-CM | POA: Insufficient documentation

## 2019-01-18 DIAGNOSIS — D473 Essential (hemorrhagic) thrombocythemia: Secondary | ICD-10-CM

## 2019-01-18 DIAGNOSIS — D7589 Other specified diseases of blood and blood-forming organs: Secondary | ICD-10-CM

## 2019-01-18 LAB — COMPREHENSIVE METABOLIC PANEL
ALT: 16 U/L (ref 0–44)
AST: 18 U/L (ref 15–41)
Albumin: 4.2 g/dL (ref 3.5–5.0)
Alkaline Phosphatase: 70 U/L (ref 38–126)
Anion gap: 10 (ref 5–15)
BUN: 25 mg/dL — ABNORMAL HIGH (ref 8–23)
CO2: 25 mmol/L (ref 22–32)
Calcium: 9.5 mg/dL (ref 8.9–10.3)
Chloride: 101 mmol/L (ref 98–111)
Creatinine, Ser: 0.88 mg/dL (ref 0.44–1.00)
GFR calc Af Amer: >60 mL/min (ref >60)
GFR calc non Af Amer: >60 mL/min (ref >60)
Glucose, Bld: 106 mg/dL — ABNORMAL HIGH (ref 70–99)
Potassium: 4.3 mmol/L (ref 3.5–5.1)
Sodium: 136 mmol/L (ref 135–145)
Total Bilirubin: 0.4 mg/dL (ref 0.3–1.2)
Total Protein: 7.6 g/dL (ref 6.5–8.1)

## 2019-01-18 LAB — CBC WITH DIFFERENTIAL/PLATELET
Abs Immature Granulocytes: 0.02 10*3/uL (ref 0.00–0.07)
Basophils Absolute: 0.1 10*3/uL (ref 0.0–0.1)
Basophils Relative: 1 %
Eosinophils Absolute: 0.1 10*3/uL (ref 0.0–0.5)
Eosinophils Relative: 2 %
HCT: 40.5 % (ref 36.0–46.0)
Hemoglobin: 14 g/dL (ref 12.0–15.0)
Immature Granulocytes: 0 %
Lymphocytes Relative: 30 %
Lymphs Abs: 2.3 10*3/uL (ref 0.7–4.0)
MCH: 35.7 pg — ABNORMAL HIGH (ref 26.0–34.0)
MCHC: 34.6 g/dL (ref 30.0–36.0)
MCV: 103.3 fL — ABNORMAL HIGH (ref 80.0–100.0)
Monocytes Absolute: 0.8 10*3/uL (ref 0.1–1.0)
Monocytes Relative: 10 %
Neutro Abs: 4.4 10*3/uL (ref 1.7–7.7)
Neutrophils Relative %: 57 %
Platelets: 304 10*3/uL (ref 150–400)
RBC: 3.92 MIL/uL (ref 3.87–5.11)
RDW: 11.9 % (ref 11.5–15.5)
WBC: 7.6 10*3/uL (ref 4.0–10.5)
nRBC: 0 % (ref 0.0–0.2)

## 2019-01-18 LAB — VITAMIN B12: Vitamin B-12: 713 pg/mL (ref 180–914)

## 2019-01-18 LAB — FOLATE: Folate: 18.2 ng/mL (ref >5.9)

## 2019-01-18 NOTE — Progress Notes (Signed)
No new changes noted today 

## 2019-03-04 ENCOUNTER — Encounter: Payer: Self-pay | Admitting: Hematology and Oncology

## 2019-03-04 ENCOUNTER — Other Ambulatory Visit: Payer: Self-pay | Admitting: Hematology and Oncology

## 2019-03-05 NOTE — Telephone Encounter (Signed)
CBC with Differential/Platelet Order: [id number] Status:  Final result Visible to patient:  Yes (MyChart) Next appt:  04/12/2019 at 10:00 AM in Oncology ([facility]) Dx:  Essential thrombocytosis ([place])  Ref Range & Units 35mo ago 72mo ago 62mo ago  WBC 4.0 - 10.5 K/uL 7.6  7.5  8.1   RBC 3.87 - 5.11 MIL/uL 3.92  3.77Low   3.58Low    Hemoglobin 12.0 - 15.0 g/dL 14.0  13.7  13.4   HCT 36.0 - 46.0 % 40.5  39.7  38.2   MCV 80.0 - 100.0 fL 103.3High   105.3High   106.7High    MCH 26.0 - 34.0 pg 35.7High   36.3High   37.4High    MCHC 30.0 - 36.0 g/dL 34.6  34.5  35.1   RDW 11.5 - 15.5 % 11.9  12.0  12.5   Platelets 150 - 400 K/uL 304  299  307   nRBC 0.0 - 0.2 % 0.0  0.0  0.0   Neutrophils Relative % % 57  65  62   Neutro Abs 1.7 - 7.7 K/uL 4.4  4.9  5.0   Lymphocytes Relative % 30  23  27    Lymphs Abs 0.7 - 4.0 K/uL 2.3  1.7  2.2   Monocytes Relative % 10  10  10    Monocytes Absolute 0.1 - 1.0 K/uL 0.8  0.8  0.8   Eosinophils Relative % 2  1  1    Eosinophils Absolute 0.0 - 0.5 K/uL 0.1  0.1  0.1   Basophils Relative % 1  1  0   Basophils Absolute 0.0 - 0.1 K/uL 0.1  0.1  0.0   Immature Granulocytes % 0  0  0   Abs Immature Granulocytes 0.00 - 0.07 K/uL 0.02  0.02 CM  0.01 CM   Comment: Performed at [facility], [address], [place], [place] [address]  Resulting Agency  [facility] CLIN LAB [facility] CLIN LAB [facility] CLIN LAB      Specimen Collected: 01/18/19 08:52 Last Resulted: 01/18/19 09:04     Lab Flowsheet   Order Details   View Encounter   Lab and Collection Details   Routing   Result History     CM=Additional comments      Other Results from 01/18/2019  Folate Order: [id number]  Status:  Final result Visible to patient:  Yes (MyChart) Next appt:  04/12/2019 at 10:00 AM in Oncology ([facility]) Dx:  Macrocytosis 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  Ref Range & Units 38mo ago  Folate >5.9 ng/mL 18.2   Comment: Performed at [facility], [address] [place],  [place] [address]  Resulting Agency  [facility] CLIN LAB      Specimen Collected: 01/18/19 08:52 Last Resulted: 01/18/19 12:55     Lab Flowsheet   Order Details   View Encounter   Lab and Collection Details   Routing   Result History           Vitamin B12 Order: [id number]  Status:  Final result Visible to patient:  Yes (MyChart) Next appt:  04/12/2019 at 10:00 AM in Oncology ([facility]) Dx:  Macrocytosis 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  Ref Range & Units 31mo ago  Vitamin B-12 180 - 914 pg/mL 713   Comment: (NOTE)  This assay is not validated for testing neonatal or  myeloproliferative syndrome specimens for Vitamin B12 levels.  Performed at [facility], [place] [address] [place], [place]  [address]   Resulting Agency  [facility]  CLIN LAB      Specimen Collected: 01/18/19 08:52 Last Resulted: 01/18/19 15:21     Lab Flowsheet   Order Details   View Encounter   Lab and Collection Details   Routing   Result History           Contains abnormal data Comprehensive metabolic panel Order: [id number]  Status:  Final result Visible to patient:  Yes (MyChart) Next appt:  04/12/2019 at 10:00 AM in Oncology ([facility]) Dx:  Essential thrombocytosis ([place])  Ref Range & Units 18mo ago 48mo ago 1mo ago  Sodium 135 - 145 mmol/L 136  136  137   Potassium 3.5 - 5.1 mmol/L 4.3  3.7  3.7   Chloride 98 - 111 mmol/L 101  100  104   CO2 22 - 32 mmol/L 25  26  25    Glucose, Bld 70 - 99 mg/dL 106High   101High   95   BUN 8 - 23 mg/dL 25High   25High   20   Creatinine, Ser 0.44 - 1.00 mg/dL 0.88  0.93  0.77   Calcium 8.9 - 10.3 mg/dL 9.5  9.0  9.1   Total Protein 6.5 - 8.1 g/dL 7.6  7.5  7.4   Albumin 3.5 - 5.0 g/dL 4.2  4.1  3.9   AST 15 - 41 U/L 18  21  19    ALT 0 - 44 U/L 16  18  15    Alkaline Phosphatase 38 - 126 U/L 70  69  74   Total Bilirubin 0.3 - 1.2 mg/dL 0.4  0.9  0.8   GFR calc non Af Amer >60 mL/min >60  >60  >60   GFR calc Af Amer >60 mL/min >60  >60  >60   Anion gap 5 - 15 10  10  CM   8 CM   Comment: Performed at [facility], [address], [place], [place] [address]  Resulting Agency  [facility] CLIN LAB [facility] CLIN LAB [facility] CLIN LAB      Specimen Collected: 01/18/19 08:52 Last Resulted: 01/18/19 09:16

## 2019-04-10 NOTE — Progress Notes (Signed)
[facility]  [address] [place], [place] [address] Phone: [phone]  Fax: [phone]   Clinic Day:  04/12/2019  Referring physician: Maryland Pink, MD  Chief Complaint: Emily Wallace is a 68 y.o. 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  HPI: The patient was last seen in the hematology clinic on 01/18/2019. At that time, she was doing well. Exam was stable. Platelets were 304,000 (goal <400,000). Folate was 18.2. B12 was 713. Patient continued on hydroxyurea 500 mg BID.  During the interim, she has felt "better than most". 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 Her heartburn is better, so she tries to eat dinner earlier in the evening. She has muscles aches induced by exercise. She has joint pain in her thumb and she wears compression gloves. She denies any joint pain today.    Past Medical History:  Diagnosis Date   Anxiety    GERD (gastroesophageal reflux disease)    Hypertension    Joint pain    Vitamin D deficiency     Past Surgical History:  Procedure Laterality Date   GALLBLADDER SURGERY      Family History  Problem Relation Age of Onset   Colon cancer Brother    Colon cancer Maternal Aunt    Breast cancer Maternal Aunt    Colon cancer Paternal Aunt    Diabetes Maternal Grandmother    Colon cancer Paternal Grandmother     Social History:  reports that she has never smoked. She has never used smokeless tobacco. No history on file for alcohol and drug. Patient smoked "3 cigarettes" the whole time while she was in college. She has rarely drinks alcohol. Patient is a retired Visual merchandiser. Patient denies known exposures to radiation on toxins. She lives in [place]. She has been  married for 43 years. She has does water aerobics, and lifts weights. She cooks more. She enjoys 1 hour and 30 minute massages. The patient is alone today.  Allergies:  Allergies  Allergen Reactions   Codone [Hydrocodone] Rash    Current Medications: Current Outpatient Medications  Medication Sig Dispense Refill   ACAI PO Take by mouth daily. 1000 mg daily     aspirin EC 81 MG tablet Take 81 mg by mouth daily.     b complex vitamins capsule Take 1 capsule by mouth daily.     esomeprazole (NEXIUM) 40 MG capsule Take 40 mg by mouth daily at 12 noon.     famotidine (PEPCID) 40 MG tablet Take 40 mg by mouth daily.     hydroxyurea (HYDREA) 500 MG capsule TAKE 1 CAPSULE(500 MG) BY MOUTH TWICE DAILY. MAY TAKE WITH FOOD TO MINIMIZE GI SIDE EFFECTS 60 capsule 1   losartan (COZAAR) 50 MG tablet Take 50 mg by mouth daily.     sertraline (ZOLOFT) 100 MG tablet Take 100 mg by mouth daily.     TURMERIC PO Take by mouth daily. 1500 mg daily     VITAMIN D, ERGOCALCIFEROL, PO Take 5,000 Units by mouth daily.     No current facility-administered medications for this visit.     Review of Systems  Constitutional: Negative.  Negative for chills, diaphoresis, fever, malaise/fatigue and weight loss (up 3 pounds).  Feels "better than most".  HENT: Negative.  Negative for congestion, hearing loss, nosebleeds, sinus pain and sore throat.   Eyes: Negative.  Negative for blurred vision and double vision.  Respiratory: Negative.  Negative for cough, hemoptysis, sputum production and shortness of breath.   Cardiovascular: Negative.  Negative for chest pain, palpitations, orthopnea, leg swelling and PND.  Gastrointestinal: Negative for abdominal pain, blood in stool, constipation, diarrhea, heartburn (on PPI therapy; depends on what she eats; better), melena, nausea and vomiting.  Genitourinary: Negative.  Negative for dysuria, frequency, hematuria and urgency.  Musculoskeletal: Positive for joint  pain (arthritis) and myalgias (exercise induced). Negative for back pain and falls.  Skin: Negative.  Negative for itching and rash.  Neurological: Negative.  Negative for dizziness, tremors, sensory change, speech change, focal weakness, weakness and headaches.  Endo/Heme/Allergies: Negative.  Negative for environmental allergies. Does not bruise/bleed easily.  Psychiatric/Behavioral: Negative.  Negative for depression and memory loss. The patient is not nervous/anxious and does not have insomnia.        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  All other systems reviewed and are negative.  Performance status (ECOG): 0-1  Vitals Blood pressure 135/81, pulse 88, temperature 98.1 F (36.7 C), temperature source Tympanic, resp. rate 16, weight 201 lb 6.2 oz (91.3 kg), SpO2 96 %.   Physical Exam  Constitutional: She is oriented to person, place, and time. She appears well-developed and well-nourished. No distress.  HENT:  Head: Normocephalic.  Mouth/Throat: Oropharynx is clear and moist. No oropharyngeal exudate.  Short brown graying hair.  Mask.  Eyes: Pupils are equal, round, and reactive to light. Conjunctivae and EOM are normal. No scleral icterus.  Green eyes.  Neck: Normal range of motion. No JVD present.  Cardiovascular: Normal rate, regular rhythm and normal heart sounds.  No murmur heard. Pulmonary/Chest: Effort normal and breath sounds normal. No respiratory distress. She has no wheezes. She has no rales. She exhibits no tenderness.  Abdominal: Soft. Bowel sounds are normal. She exhibits no mass. There is no abdominal tenderness. There is no rebound and no guarding.  Musculoskeletal: Normal range of motion.        General: No tenderness or edema.  Lymphadenopathy:       Head (right side): No preauricular, no posterior auricular and no occipital adenopathy present.       Head (left side): No preauricular, no posterior auricular and no occipital adenopathy present.    She has  no cervical adenopathy.    She has no axillary adenopathy.       Right: No inguinal and no supraclavicular adenopathy present.       Left: No inguinal and no supraclavicular adenopathy present.  Neurological: She is alert and oriented to person, place, and time.  Skin: Skin is warm and dry. No rash noted. She is not diaphoretic. No erythema. No pallor.  Psychiatric: She has a normal mood and affect. Her behavior is normal. Judgment and thought content normal.  Nursing note and vitals reviewed.   = Appointment on 04/12/2019  Component Date Value Ref Range Status   Sodium 04/12/2019 137  135 - 145 mmol/L Final   Potassium 04/12/2019 4.5  3.5 - 5.1 mmol/L Final   Chloride 04/12/2019 103  98 - 111 mmol/L Final   CO2 04/12/2019 24  22 - 32 mmol/L Final   Glucose, Bld 04/12/2019 103* 70 - 99 mg/dL Final   BUN 04/12/2019 23  8 - 23 mg/dL Final   Creatinine, Ser 04/12/2019  1.05* 0.44 - 1.00 mg/dL Final   Calcium 04/12/2019 9.7  8.9 - 10.3 mg/dL Final   Total Protein 04/12/2019 7.8  6.5 - 8.1 g/dL Final   Albumin 04/12/2019 4.2  3.5 - 5.0 g/dL Final   AST 04/12/2019 17  15 - 41 U/L Final   ALT 04/12/2019 15  0 - 44 U/L Final   Alkaline Phosphatase 04/12/2019 68  38 - 126 U/L Final   Total Bilirubin 04/12/2019 0.6  0.3 - 1.2 mg/dL Final   GFR calc non Af Amer 04/12/2019 55* >60 mL/min Final   GFR calc Af Amer 04/12/2019 >60  >60 mL/min Final   Anion gap 04/12/2019 10  5 - 15 Final   Performed at [facility], [address] [place], [place] [address]   WBC 04/12/2019 6.5  4.0 - 10.5 K/uL Final   RBC 04/12/2019 3.87  3.87 - 5.11 MIL/uL Final   Hemoglobin 04/12/2019 13.9  12.0 - 15.0 g/dL Final   HCT 04/12/2019 39.7  36.0 - 46.0 % Final   MCV 04/12/2019 102.6* 80.0 - 100.0 fL Final   MCH 04/12/2019 35.9* 26.0 - 34.0 pg Final   MCHC 04/12/2019 35.0  30.0 - 36.0 g/dL Final   RDW 04/12/2019 12.0  11.5 - 15.5 % Final   Platelets 04/12/2019 322  150 - 400  K/uL Final   nRBC 04/12/2019 0.0  0.0 - 0.2 % Final   Neutrophils Relative % 04/12/2019 58  % Final   Neutro Abs 04/12/2019 3.8  1.7 - 7.7 K/uL Final   Lymphocytes Relative 04/12/2019 30  % Final   Lymphs Abs 04/12/2019 2.0  0.7 - 4.0 K/uL Final   Monocytes Relative 04/12/2019 10  % Final   Monocytes Absolute 04/12/2019 0.6  0.1 - 1.0 K/uL Final   Eosinophils Relative 04/12/2019 1  % Final   Eosinophils Absolute 04/12/2019 0.1  0.0 - 0.5 K/uL Final   Basophils Relative 04/12/2019 1  % Final   Basophils Absolute 04/12/2019 0.0  0.0 - 0.1 K/uL Final   Immature Granulocytes 04/12/2019 0  % Final   Abs Immature Granulocytes 04/12/2019 0.01  0.00 - 0.07 K/uL Final   Performed at [facility], [address], [place], [place] [address]    Assessment:  KEYASIA KRUTH is a 68 y.o. female withessential thrombocytosis (ET). Platelet counthas ranged between 48,000 - 590,000 for the past 4 years.  CBC on 12/13/2018revealed a hematocrit of 38.5, hemoglobin 13, MCV 86.7, platelets 590,000, white count 10,800 and ANC of 7600. Differential was unremarkable.  Work-up on 11/04/2017 revealed a hematocrit of 41.5, hemoglobin 14.2, MCV 87, platelets 573,000, WBC 9800 with an ANC of 6500. Differential was normal. Ferritin was 31. Iron studies included a saturation of 25% and TIBC of 293. Sed rate was 11. BCR-ABL was negative. JAK2 V617Fwas positive. von Willebrand panel was normal on 12/13/2017.  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 Reticulin and trichrome stains did not show significant increased in reticulin fibers or collagen deposition. Cytogenetics were normal (6, XX).   She began hydroxyurea500 mg/dayon 12/13/2017. Dose wasincreased to 500 mg BID on MWF and 500 mg daily on T,Th,S,S on 01/09/2018.Dose was increased to 500 mg BID on  02/06/2018.She is on a baby aspirin.  EGDon 07/25/2017 for epigastric pain and dysphasia revealed reflux esophagitis, a single gastric polyp (fundic gland polyp) normal duodenum. H. pylori and Barrett's were negative.  Colonoscopyon 09/22/2017 revealed one small polyp in the mid transverse colon (tubular adenoma) and diverticulosis in the sigmoid colon.  She has afamily history of colon cancer(twin brother and paternal aunt).  Symptomatically, she is doing well.  Exam reveals no adenopathy or hepatosplenomegaly.  Plan: 1.    Labs today: CBC with diff, CMP. 2.JAK2 (+) MPN (Essential thrombocythemia) Platelets 322,000.Platelet goal < 400,000. MCV remainselevated at 102.6secondary to hydroxyurea therapy.             Folate was 18.2 and B12 was 713 on 01/18/2019. Continue hydroxyurea 500 mg twice daily.  Continue aspirin 81 mg a day.   3.RTC in 3 months for MD assessment and labs (CBC with diff, CMP).  I discussed the assessment and treatment plan with the patient.  The patient was provided an opportunity to ask questions and all were answered.  The patient agreed with the plan and demonstrated an understanding of the instructions.  The patient was advised to call back if the symptoms worsen or if the condition fails to improve as anticipated.   Lequita Asal, MD, PhD    04/12/2019, 10:58 AM  I, Selena Batten, am acting as scribe for [employer]. Mike Gip, MD, PhD.  I, Kynsleigh Westendorf C. Mike Gip, MD, have reviewed the above documentation for accuracy and completeness, and I agree with the above.

## 2019-04-11 ENCOUNTER — Other Ambulatory Visit: Payer: Self-pay

## 2019-04-11 NOTE — Progress Notes (Signed)
Confirmed Name, DOB, and Address. Denies any concerns.  

## 2019-04-12 ENCOUNTER — Encounter: Payer: Self-pay | Admitting: Hematology and Oncology

## 2019-04-12 ENCOUNTER — Inpatient Hospital Stay: Payer: Medicare Other

## 2019-04-12 ENCOUNTER — Inpatient Hospital Stay: Payer: Medicare Other | Attending: Hematology and Oncology | Admitting: Hematology and Oncology

## 2019-04-12 VITALS — BP 135/81 | HR 88 | Temp 98.1°F | Resp 16 | Wt 201.4 lb

## 2019-04-12 DIAGNOSIS — Z7982 Long term (current) use of aspirin: Secondary | ICD-10-CM | POA: Insufficient documentation

## 2019-04-12 DIAGNOSIS — D473 Essential (hemorrhagic) thrombocythemia: Secondary | ICD-10-CM

## 2019-04-12 LAB — COMPREHENSIVE METABOLIC PANEL
ALT: 15 U/L (ref 0–44)
AST: 17 U/L (ref 15–41)
Albumin: 4.2 g/dL (ref 3.5–5.0)
Alkaline Phosphatase: 68 U/L (ref 38–126)
Anion gap: 10 (ref 5–15)
BUN: 23 mg/dL (ref 8–23)
CO2: 24 mmol/L (ref 22–32)
Calcium: 9.7 mg/dL (ref 8.9–10.3)
Chloride: 103 mmol/L (ref 98–111)
Creatinine, Ser: 1.05 mg/dL — ABNORMAL HIGH (ref 0.44–1.00)
GFR calc Af Amer: >60 mL/min (ref >60)
GFR calc non Af Amer: 55 mL/min — ABNORMAL LOW (ref >60)
Glucose, Bld: 103 mg/dL — ABNORMAL HIGH (ref 70–99)
Potassium: 4.5 mmol/L (ref 3.5–5.1)
Sodium: 137 mmol/L (ref 135–145)
Total Bilirubin: 0.6 mg/dL (ref 0.3–1.2)
Total Protein: 7.8 g/dL (ref 6.5–8.1)

## 2019-04-12 LAB — CBC WITH DIFFERENTIAL/PLATELET
Abs Immature Granulocytes: 0.01 10*3/uL (ref 0.00–0.07)
Basophils Absolute: 0 10*3/uL (ref 0.0–0.1)
Basophils Relative: 1 %
Eosinophils Absolute: 0.1 10*3/uL (ref 0.0–0.5)
Eosinophils Relative: 1 %
HCT: 39.7 % (ref 36.0–46.0)
Hemoglobin: 13.9 g/dL (ref 12.0–15.0)
Immature Granulocytes: 0 %
Lymphocytes Relative: 30 %
Lymphs Abs: 2 10*3/uL (ref 0.7–4.0)
MCH: 35.9 pg — ABNORMAL HIGH (ref 26.0–34.0)
MCHC: 35 g/dL (ref 30.0–36.0)
MCV: 102.6 fL — ABNORMAL HIGH (ref 80.0–100.0)
Monocytes Absolute: 0.6 10*3/uL (ref 0.1–1.0)
Monocytes Relative: 10 %
Neutro Abs: 3.8 10*3/uL (ref 1.7–7.7)
Neutrophils Relative %: 58 %
Platelets: 322 10*3/uL (ref 150–400)
RBC: 3.87 MIL/uL (ref 3.87–5.11)
RDW: 12 % (ref 11.5–15.5)
WBC: 6.5 10*3/uL (ref 4.0–10.5)
nRBC: 0 % (ref 0.0–0.2)

## 2019-05-09 ENCOUNTER — Other Ambulatory Visit: Payer: Self-pay | Admitting: Hematology and Oncology

## 2019-05-28 ENCOUNTER — Other Ambulatory Visit: Payer: Medicare PPO

## 2019-05-29 ENCOUNTER — Telehealth: Payer: Self-pay

## 2019-05-29 ENCOUNTER — Encounter: Payer: Self-pay | Admitting: Hematology and Oncology

## 2019-05-29 NOTE — Telephone Encounter (Signed)
Spoke with the patient to inform her that she can contact the community centers as soon and they are ready to start Plan b will start tomorrow. The patient was understanding.

## 2019-05-30 ENCOUNTER — Ambulatory Visit: Payer: Medicare PPO | Attending: Internal Medicine

## 2019-05-30 DIAGNOSIS — Z20822 Contact with and (suspected) exposure to covid-19: Secondary | ICD-10-CM

## 2019-05-31 LAB — NOVEL CORONAVIRUS, NAA: SARS-CoV-2, NAA: NOT DETECTED

## 2019-07-15 ENCOUNTER — Other Ambulatory Visit: Payer: Self-pay | Admitting: Oncology

## 2019-07-15 NOTE — Progress Notes (Signed)
[facility]  [address], [address] [place], [place] [address] Phone: [phone]  Fax: [phone]   Telemedicine Office Visit:  07/17/2019  Referring physician: Maryland Pink, MD  I connected with Emily Wallace on 07/17/2019 at 9:57 AM by videoconferencing and verified that I was speaking with the correct person using 2 identifiers.  The patient was at home.  I discussed the limitations, risk, security and privacy concerns of performing an evaluation and management service by videoconferencing and the availability of in person appointments.  I also discussed with the patient that there may be a patient responsible charge related to this service.  The patient expressed understanding and agreed to proceed.   Chief Complaint: Emily Wallace is a 69 y.o. 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  HPI: The patient was last seen in the hematology clinic on 04/12/2019. At that time, she was doing well. Exam revealed no adenopathy or hepatosplenomegaly. Hematocrit 39.7, hemoglobin 13.9, platelets 322,000, WBC 6,500. Creatinine was 1.05. She continued hydroxyurea 500 mg BID and aspirin 81 mg a day.   Bilateral mammogram on 05/22/2019 at [facility] showed no suspicious masses, malignant calcifications, sites of architectural distortion, or concerning asymmetries in either breast. There was a stable, benign-appearing mass present in the retroareolar region of the breast 1 cm lateral to the nipple line. Annual mammography was recommended.   Labs on 07/16/2019 included a hematocrit 38.0, hemoglobin 13.1, MCV 106.1, platelets 291,000, WBC 8,400  ([facility] 5200). CMP was normal.   During the interim, she has felt good. Her weight is stable. She is staying active; she teaches water aerobics. She is swimming 3-4 days/week. She has received both COVID vaccines (second vaccine 07/09/2019).   She is taking Tylenol and ibuprofen for neck and  joint pain. She has trouble with her heartburn after 7 pm; she has Pepcid at her bedside.  She denies any adenopathy. She has had no bruising or bleeding.    Past Medical History:  Diagnosis Date  . Anxiety   . GERD (gastroesophageal reflux disease)   . Hypertension   . Joint pain   . Vitamin D deficiency     Past Surgical History:  Procedure Laterality Date  . GALLBLADDER SURGERY      Family History  Problem Relation Age of Onset  . Colon cancer Brother   . Colon cancer Maternal Aunt   . Breast cancer Maternal Aunt   . Colon cancer Paternal Aunt   . Diabetes Maternal Grandmother   . Colon cancer Paternal Grandmother     Social History:  reports that she has never smoked. She has never used smokeless tobacco. No history on file for alcohol and drug. Patient smoked "3 cigarettes" the whole time while she was in college. She has rarely drinks alcohol. Patient is a retired Visual merchandiser. Patient denies known exposures to radiation on toxins. She lives in [place]. She has been married for 43 years.She has does water aerobics, and lifts weights. She cooks more. She enjoys 1 hour and 30 minute massages. She notes an upcoming trip with her friends to [place]; they plan to drive to [place]. Her friends have been vaccinated against COVID-19.  The patient is accompanied by her husband today.  Participants in the patient's visit and their role in the encounter included the patient, her husband, and Vito Berger, CMA, today.  The intake visit was provided by Vito Berger, CMA.  Allergies:  Allergies  Allergen Reactions  . Codone [Hydrocodone] Rash    Current Medications: Current Outpatient Medications  Medication Sig Dispense Refill  . ACAI PO Take by mouth daily. 1000 mg daily    . aspirin EC 81 MG tablet Take 81 mg by mouth daily.    Marland Kitchen b complex vitamins capsule Take 1 capsule by mouth daily.    Marland Kitchen esomeprazole (NEXIUM) 40 MG capsule Take 40 mg by mouth  daily at 12 noon.    . famotidine (PEPCID) 40 MG tablet Take 40 mg by mouth daily.    . hydroxyurea (HYDREA) 500 MG capsule TAKE 1 CAPSULE(500 MG) BY MOUTH TWICE DAILY. MAY TAKE WITH FOOD TO MINIMIZE GI SIDE EFFECTS 60 capsule 1  . losartan (COZAAR) 50 MG tablet Take 50 mg by mouth daily.    . sertraline (ZOLOFT) 100 MG tablet Take 100 mg by mouth daily.    . TURMERIC PO Take by mouth daily. 1500 mg daily    . VITAMIN D, ERGOCALCIFEROL, PO Take 5,000 Units by mouth daily.     No current facility-administered medications for this visit.     Review of Systems  Constitutional: Negative.  Negative for chills, diaphoresis, fever, malaise/fatigue and weight loss (stable).       Feels "good".  HENT: Negative.  Negative for congestion, ear pain, hearing loss, nosebleeds, sinus pain, sore throat and tinnitus.   Eyes: Negative.  Negative for blurred vision and double vision.  Respiratory: Negative.  Negative for cough, hemoptysis, sputum production and shortness of breath.   Cardiovascular: Negative.  Negative for chest pain, palpitations, orthopnea, leg swelling and PND.  Gastrointestinal: Positive for heartburn (on PPI therapy- overall better; depends on what she eats). Negative for abdominal pain, blood in stool, constipation, diarrhea, melena, nausea and vomiting.  Genitourinary: Negative.  Negative for dysuria, frequency, hematuria and urgency.  Musculoskeletal: Positive for joint pain (arthritis) and neck pain. Negative for back pain, falls and myalgias.  Skin: Negative.  Negative for itching and rash.  Neurological: Negative.  Negative for dizziness, tremors, sensory change, speech change, focal weakness, weakness and headaches.  Endo/Heme/Allergies: Negative.  Negative for environmental allergies. Does not bruise/bleed easily.  Psychiatric/Behavioral: Negative.  Negative for depression and memory loss. The patient is not nervous/anxious and does not have insomnia.   All other systems reviewed  and are negative.   Performance status (ECOG): 0  Physical Exam  Constitutional: She is oriented to person, place, and time. She appears well-developed and well-nourished. No distress.  HENT:  Head: Normocephalic and atraumatic.  Shoulder length graying hair.  Eyes: Conjunctivae and EOM are normal. No scleral icterus.  Blue eyes.  Neurological: She is alert and oriented to person, place, and time.  Skin: She is not diaphoretic.  Psychiatric: She has a normal mood and affect. Her behavior is normal. Judgment and thought content normal.  Nursing note and vitals reviewed.   Appointment on 07/16/2019  Component Date Value Ref Range Status  . Sodium 07/16/2019 137  135 - 145 mmol/L Final  . Potassium 07/16/2019 3.8  3.5 - 5.1 mmol/L Final  . Chloride 07/16/2019 101  98 - 111 mmol/L Final  . CO2 07/16/2019 27  22 - 32 mmol/L Final  . Glucose, Bld 07/16/2019 86  70 - 99 mg/dL Final  . BUN 07/16/2019 20  8 - 23 mg/dL Final  . Creatinine, Ser 07/16/2019 0.93  0.44 - 1.00 mg/dL Final  . Calcium 07/16/2019 9.0  8.9 - 10.3 mg/dL Final  . Total Protein  07/16/2019 7.5  6.5 - 8.1 g/dL Final  . Albumin 07/16/2019 4.0  3.5 - 5.0 g/dL Final  . AST 07/16/2019 19  15 - 41 U/L Final  . ALT 07/16/2019 17  0 - 44 U/L Final  . Alkaline Phosphatase 07/16/2019 59  38 - 126 U/L Final  . Total Bilirubin 07/16/2019 0.8  0.3 - 1.2 mg/dL Final  . GFR calc non Af Amer 07/16/2019 >60  >60 mL/min Final  . GFR calc Af Amer 07/16/2019 >60  >60 mL/min Final  . Anion gap 07/16/2019 9  5 - 15 Final   Performed at [facility], [address] [place], [place] [address]  . WBC 07/16/2019 8.4  4.0 - 10.5 K/uL Final  . RBC 07/16/2019 3.58* 3.87 - 5.11 MIL/uL Final  . Hemoglobin 07/16/2019 13.1  12.0 - 15.0 g/dL Final  . HCT 07/16/2019 38.0  36.0 - 46.0 % Final  . MCV 07/16/2019 106.1* 80.0 - 100.0 fL Final  . MCH 07/16/2019 36.6* 26.0 - 34.0 pg Final  . MCHC 07/16/2019 34.5  30.0 - 36.0 g/dL Final  .  RDW 07/16/2019 11.9  11.5 - 15.5 % Final  . Platelets 07/16/2019 291  150 - 400 K/uL Final  . nRBC 07/16/2019 0.0  0.0 - 0.2 % Final  . Neutrophils Relative % 07/16/2019 62  % Final  . Neutro Abs 07/16/2019 5.2  1.7 - 7.7 K/uL Final  . Lymphocytes Relative 07/16/2019 28  % Final  . Lymphs Abs 07/16/2019 2.4  0.7 - 4.0 K/uL Final  . Monocytes Relative 07/16/2019 8  % Final  . Monocytes Absolute 07/16/2019 0.6  0.1 - 1.0 K/uL Final  . Eosinophils Relative 07/16/2019 1  % Final  . Eosinophils Absolute 07/16/2019 0.1  0.0 - 0.5 K/uL Final  . Basophils Relative 07/16/2019 1  % Final  . Basophils Absolute 07/16/2019 0.1  0.0 - 0.1 K/uL Final  . Immature Granulocytes 07/16/2019 0  % Final  . Abs Immature Granulocytes 07/16/2019 0.02  0.00 - 0.07 K/uL Final   Performed at [facility], [address] [place], [place] [address]    Assessment:  Emily Wallace is a 69 y.o. female withessential thrombocytosis (ET). Platelet counthas ranged between 29,000 - 590,000 for the past 4 years.  CBC on 12/13/2018revealed a hematocrit of 38.5, hemoglobin 13, MCV 86.7, platelets 590,000, white count 10,800 and ANC of 7600. Differential was unremarkable.  Work-up on 11/04/2017 revealed a hematocrit of 41.5, hemoglobin 14.2, MCV 87, platelets 573,000, WBC 9800 with an ANC of 6500. Differential was normal. Ferritin was 31. Iron studies included a saturation of 25% and TIBC of 293. Sed rate was 11. BCR-ABL was negative. JAK2 V617Fwas positive. von Willebrand panel was normal on 12/13/2017.  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 Reticulin and trichrome stains did not show significant increased in reticulin fibers or collagen deposition. Cytogenetics were normal (58, XX).   She began hydroxyurea500 mg/dayon 12/13/2017. Dose wasincreased to 500 mg BID on MWF and  500 mg daily on T,Th,S,S on 01/09/2018.Dose was increased to 500 mg BID on 02/06/2018.She is on a baby aspirin.  EGDon 07/25/2017 for epigastric pain and dysphasia revealed reflux esophagitis, a single gastric polyp (fundic gland polyp) normal duodenum. H. pylori and Barrett's were negative. Colonoscopyon 09/22/2017 revealed one small polyp in the mid transverse colon (tubular adenoma) and diverticulosis in the sigmoid colon.  She  has afamily history of colon cancer(twin brother and paternal aunt).  She has received the second COVID-19 vaccine on 07/09/2019.  Symptomatically, she is doing well.  She denies any B symptoms.  Plan: 1.   Review labs from 07/16/2019. 2.JAK2 (+) MPN (Essential thrombocythemia) Symptomatically, she denies any complaints.  Exam on 04/11/2019 revealed no adenopathy or hepatosplenomegaly.   Platelets291,000.Platelet goal < 400,000. Continue hydroxyurea 500 mg po BID. Continue aspirin 81 mg a day. 3.Macrocytosis  Etiology likely secondary to hydroxyurea.  B12 was 713.  Folate 18.2 on 01/18/2019.  Check B12, folate, and TSH annually. 4.   RTC in 3 months for MD assessment and labs (CBC with diff, CMP).  I discussed the assessment and treatment plan with the patient.  The patient was provided an opportunity to ask questions and all were answered.  The patient agreed with the plan and demonstrated an understanding of the instructions.  The patient was advised to call back or seek an in person evaluation if the symptoms worsen or if the condition fails to improve as anticipated.  I provided 16 minutes (9:57 AM - 10:13 AM) of face-to-face video visit time during this this encounter and > 50% was spent counseling as documented under my assessment and plan. An additional 5 minutes were spent reviewing her chart (Epic and Care Everywhere) including notes, labs, and imaging studies. I provided these services from the [facility] office.   Nolon Stalls, MD, PhD  07/17/2019, 9:57 AM  I, Selena Batten, am acting as scribe for [employer]. Mike Gip, MD, PhD.  I, Schawn Byas C. Mike Gip, MD, have reviewed the above documentation for accuracy and completeness, and I agree with the above.

## 2019-07-16 ENCOUNTER — Inpatient Hospital Stay: Payer: Medicare PPO | Attending: Hematology and Oncology

## 2019-07-16 ENCOUNTER — Other Ambulatory Visit: Payer: Self-pay

## 2019-07-16 ENCOUNTER — Encounter: Payer: Self-pay | Admitting: Hematology and Oncology

## 2019-07-16 DIAGNOSIS — D473 Essential (hemorrhagic) thrombocythemia: Secondary | ICD-10-CM | POA: Diagnosis not present

## 2019-07-16 DIAGNOSIS — Z79899 Other long term (current) drug therapy: Secondary | ICD-10-CM | POA: Diagnosis not present

## 2019-07-16 LAB — CBC WITH DIFFERENTIAL/PLATELET
Abs Immature Granulocytes: 0.02 10*3/uL (ref 0.00–0.07)
Basophils Absolute: 0.1 10*3/uL (ref 0.0–0.1)
Basophils Relative: 1 %
Eosinophils Absolute: 0.1 10*3/uL (ref 0.0–0.5)
Eosinophils Relative: 1 %
HCT: 38 % (ref 36.0–46.0)
Hemoglobin: 13.1 g/dL (ref 12.0–15.0)
Immature Granulocytes: 0 %
Lymphocytes Relative: 28 %
Lymphs Abs: 2.4 10*3/uL (ref 0.7–4.0)
MCH: 36.6 pg — ABNORMAL HIGH (ref 26.0–34.0)
MCHC: 34.5 g/dL (ref 30.0–36.0)
MCV: 106.1 fL — ABNORMAL HIGH (ref 80.0–100.0)
Monocytes Absolute: 0.6 10*3/uL (ref 0.1–1.0)
Monocytes Relative: 8 %
Neutro Abs: 5.2 10*3/uL (ref 1.7–7.7)
Neutrophils Relative %: 62 %
Platelets: 291 10*3/uL (ref 150–400)
RBC: 3.58 MIL/uL — ABNORMAL LOW (ref 3.87–5.11)
RDW: 11.9 % (ref 11.5–15.5)
WBC: 8.4 10*3/uL (ref 4.0–10.5)
nRBC: 0 % (ref 0.0–0.2)

## 2019-07-16 LAB — COMPREHENSIVE METABOLIC PANEL
ALT: 17 U/L (ref 0–44)
AST: 19 U/L (ref 15–41)
Albumin: 4 g/dL (ref 3.5–5.0)
Alkaline Phosphatase: 59 U/L (ref 38–126)
Anion gap: 9 (ref 5–15)
BUN: 20 mg/dL (ref 8–23)
CO2: 27 mmol/L (ref 22–32)
Calcium: 9 mg/dL (ref 8.9–10.3)
Chloride: 101 mmol/L (ref 98–111)
Creatinine, Ser: 0.93 mg/dL (ref 0.44–1.00)
GFR calc Af Amer: >60 mL/min (ref >60)
GFR calc non Af Amer: >60 mL/min (ref >60)
Glucose, Bld: 86 mg/dL (ref 70–99)
Potassium: 3.8 mmol/L (ref 3.5–5.1)
Sodium: 137 mmol/L (ref 135–145)
Total Bilirubin: 0.8 mg/dL (ref 0.3–1.2)
Total Protein: 7.5 g/dL (ref 6.5–8.1)

## 2019-07-16 NOTE — Progress Notes (Signed)
No new changes noted today. The patient name and DOB has been verified by phone today. 

## 2019-07-17 ENCOUNTER — Inpatient Hospital Stay (HOSPITAL_BASED_OUTPATIENT_CLINIC_OR_DEPARTMENT_OTHER): Payer: Medicare PPO | Admitting: Hematology and Oncology

## 2019-07-17 ENCOUNTER — Other Ambulatory Visit: Payer: Medicare Other

## 2019-07-17 ENCOUNTER — Encounter: Payer: Self-pay | Admitting: Hematology and Oncology

## 2019-07-17 DIAGNOSIS — D7589 Other specified diseases of blood and blood-forming organs: Secondary | ICD-10-CM | POA: Diagnosis not present

## 2019-07-17 DIAGNOSIS — D473 Essential (hemorrhagic) thrombocythemia: Secondary | ICD-10-CM | POA: Diagnosis not present

## 2019-09-14 ENCOUNTER — Other Ambulatory Visit: Payer: Self-pay | Admitting: Otolaryngology

## 2019-09-14 DIAGNOSIS — K118 Other diseases of salivary glands: Secondary | ICD-10-CM

## 2019-09-24 ENCOUNTER — Other Ambulatory Visit: Payer: Self-pay

## 2019-09-24 ENCOUNTER — Ambulatory Visit
Admission: RE | Admit: 2019-09-24 | Discharge: 2019-09-24 | Disposition: A | Discharge status: Home / Self Care | Payer: Medicare PPO | Source: Ambulatory Visit | Attending: Otolaryngology | Admitting: Otolaryngology

## 2019-09-24 DIAGNOSIS — K118 Other diseases of salivary glands: Secondary | ICD-10-CM | POA: Insufficient documentation

## 2019-10-15 NOTE — Progress Notes (Signed)
[facility]  [address] [place], [place] [address] Phone: [phone]  Fax: [phone]   Clinic Day:  10/16/2019  Referring physician: Maryland Pink, MD  Chief Complaint: Emily Wallace is a 69 y.o. 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  HPI: The patient was last seen in the hematology clinic on 07/17/2019 for a telemedicine visit. At that time, she was doing well. She denied any B symptoms. Hematocrit was 38.0, hemoglobin 13.1, MCV 106.1, platelets 291,000, WBC 8,400. CMP was normal. She continued hydroxyurea 500 mg po BID.   Symptomatically, the patient is feeling sleepy today.  Patient was concerned about taking ibuprofen with hydroxyurea. I assured the patient she could take ibuprofen. She states that she get lightheaded from time to time, especially when going up her stairs at home. Sometimes she is dizzy upon standing. She takes meclizine occasionally when her sinuses affect her inner ear. She notes sinus issues. She teaches water aerobics classes. She reports her blood pressure is good. She continues to have heartburn. She is trying to stop eating after 8 PM. She has joint pain when she eats a lot of protein.    Past Medical History:  Diagnosis Date  . Anxiety   . GERD (gastroesophageal reflux disease)   . Hypertension   . Joint pain   . Vitamin D deficiency     Past Surgical History:  Procedure Laterality Date  . GALLBLADDER SURGERY      Family History  Problem Relation Age of Onset  . Colon cancer Brother   . Colon cancer Maternal Aunt   . Breast cancer Maternal Aunt   . Colon cancer Paternal Aunt   . Diabetes Maternal Grandmother   . Colon cancer Paternal Grandmother     Social History:  reports that she has never smoked. She has never used smokeless tobacco. No history on file for alcohol and drug. Patient smoked "3 cigarettes" the whole time while she was in college.  She has rarely drinks alcohol. Patient is a retired Visual merchandiser. Patient denies known exposures to radiation on toxins. She lives in [place]. She has been married for 43 years.She has does water aerobics, and lifts weights. She cooks more. She enjoys 1 hour and 30 minute massages. She notes an upcoming trip with her friends to [place]; they plan to drive to [place]. Her friends have been vaccinated against COVID-19.  The patient is alone today.   Allergies:  Allergies  Allergen Reactions  . Codeine     hallucinations  . Codone [Hydrocodone] Rash    Current Medications: Current Outpatient Medications  Medication Sig Dispense Refill  . ACAI PO Take by mouth daily. 1000 mg daily    . Ascorbic Acid (VITAMIN C) 100 MG tablet Take 100 mg by mouth daily.    Marland Kitchen aspirin EC 81 MG tablet Take 81 mg by mouth daily.    Marland Kitchen azelastine (ASTELIN) 0.1 % nasal spray     . b complex vitamins capsule Take 1 capsule by mouth daily.    . Calcium-Magnesium-Zinc (CAL-MAG-ZINC PO) Take by mouth.    . esomeprazole (NEXIUM) 40 MG capsule Take 40 mg by mouth daily at 12 noon.    . famotidine (PEPCID) 40 MG tablet Take 40 mg by mouth daily.    . hydroxyurea (HYDREA) 500 MG capsule TAKE 1 CAPSULE(500 MG) BY MOUTH TWICE DAILY. MAY TAKE WITH FOOD TO MINIMIZE GI SIDE EFFECTS 60 capsule  1  . losartan (COZAAR) 50 MG tablet Take 50 mg by mouth daily.    . sertraline (ZOLOFT) 100 MG tablet Take 100 mg by mouth daily.    . TURMERIC PO Take by mouth daily. 1500 mg daily    . VITAMIN D, ERGOCALCIFEROL, PO Take 5,000 Units by mouth daily.     No current facility-administered medications for this visit.     Review of Systems  Constitutional: Negative.  Negative for chills, diaphoresis, fever, malaise/fatigue and weight loss (stable).       Feels sleepy. Active.  HENT: Negative.  Negative for congestion, ear pain, hearing loss, nosebleeds, sinus pain, sore throat and tinnitus.        Sinus issues.  Eyes:  Negative.  Negative for blurred vision and double vision.  Respiratory: Negative.  Negative for cough, hemoptysis, sputum production and shortness of breath.   Cardiovascular: Negative.  Negative for chest pain, palpitations, orthopnea, leg swelling and PND.  Gastrointestinal: Positive for heartburn (on PPI therapy- overall better; depends on what she eats). Negative for abdominal pain, blood in stool, constipation, diarrhea, melena, nausea and vomiting.  Genitourinary: Negative.  Negative for dysuria, frequency, hematuria and urgency.  Musculoskeletal: Positive for joint pain (arthritis). Negative for back pain, falls, myalgias and neck pain.  Skin: Negative.  Negative for itching and rash.  Neurological: Positive for dizziness (upon standing). Negative for tremors, sensory change, speech change, focal weakness, weakness and headaches.       Lightheaded at times.  Endo/Heme/Allergies: Negative.  Negative for environmental allergies. Does not bruise/bleed easily.  Psychiatric/Behavioral: Negative.  Negative for depression and memory loss. The patient is not nervous/anxious and does not have insomnia.   All other systems reviewed and are negative.   Performance status (ECOG): 0  Vitals: Blood pressure 112/78, pulse 73, temperature (!) 95.2 F (35.1 C), temperature source Tympanic, resp. rate 16, weight 202 lb 0.8 oz (91.7 kg), SpO2 100 %.  Physical Exam  Constitutional: She is oriented to person, place, and time. She appears well-developed and well-nourished. No distress.  HENT:  Head: Normocephalic and atraumatic.  Mouth/Throat: Oropharynx is clear and moist. No oropharyngeal exudate.  Shoulder length graying hair. Mask.  Eyes: Pupils are equal, round, and reactive to light. Conjunctivae and EOM are normal. No scleral icterus.  Blue eyes.  Cardiovascular: Normal rate, regular rhythm and normal heart sounds.  No murmur heard. Pulmonary/Chest: Effort normal and breath sounds normal. No  respiratory distress. She has no wheezes. She has no rales. She exhibits no tenderness.  Abdominal: Soft. Bowel sounds are normal. She exhibits no distension and no mass. There is no abdominal tenderness. There is no rebound and no guarding.  Musculoskeletal:        General: No tenderness or edema. Normal range of motion.     Cervical back: Normal range of motion and neck supple.  Lymphadenopathy:       Head (right side): No preauricular, no posterior auricular and no occipital adenopathy present.       Head (left side): No preauricular, no posterior auricular and no occipital adenopathy present.    She has no cervical adenopathy.    She has no axillary adenopathy.       Right: No inguinal and no supraclavicular adenopathy present.       Left: No inguinal and no supraclavicular adenopathy present.  Neurological: She is alert and oriented to person, place, and time.  Skin: Skin is warm and dry. She is not diaphoretic.  Psychiatric:  She has a normal mood and affect. Her behavior is normal. Judgment and thought content normal.  Nursing note and vitals reviewed.   Appointment on 10/16/2019  Component Date Value Ref Range Status  . Sodium 10/16/2019 136  135 - 145 mmol/L Final  . Potassium 10/16/2019 3.7  3.5 - 5.1 mmol/L Final  . Chloride 10/16/2019 102  98 - 111 mmol/L Final  . CO2 10/16/2019 26  22 - 32 mmol/L Final  . Glucose, Bld 10/16/2019 87  70 - 99 mg/dL Final   Glucose reference range applies only to samples taken after fasting for at least 8 hours.  . BUN 10/16/2019 23  8 - 23 mg/dL Final  . Creatinine, Ser 10/16/2019 0.92  0.44 - 1.00 mg/dL Final  . Calcium 10/16/2019 9.3  8.9 - 10.3 mg/dL Final  . Total Protein 10/16/2019 7.8  6.5 - 8.1 g/dL Final  . Albumin 10/16/2019 4.2  3.5 - 5.0 g/dL Final  . AST 10/16/2019 18  15 - 41 U/L Final  . ALT 10/16/2019 16  0 - 44 U/L Final  . Alkaline Phosphatase 10/16/2019 62  38 - 126 U/L Final  . Total Bilirubin 10/16/2019 0.8  0.3 - 1.2  mg/dL Final  . GFR calc non Af Amer 10/16/2019 >60  >60 mL/min Final  . GFR calc Af Amer 10/16/2019 >60  >60 mL/min Final  . Anion gap 10/16/2019 8  5 - 15 Final   Performed at [facility], [address] [place], [place] [address]  . WBC 10/16/2019 6.4  4.0 - 10.5 K/uL Final  . RBC 10/16/2019 3.68* 3.87 - 5.11 MIL/uL Final  . Hemoglobin 10/16/2019 13.2  12.0 - 15.0 g/dL Final  . HCT 10/16/2019 38.9  36.0 - 46.0 % Final  . MCV 10/16/2019 105.7* 80.0 - 100.0 fL Final  . MCH 10/16/2019 35.9* 26.0 - 34.0 pg Final  . MCHC 10/16/2019 33.9  30.0 - 36.0 g/dL Final  . RDW 10/16/2019 11.8  11.5 - 15.5 % Final  . Platelets 10/16/2019 328  150 - 400 K/uL Final  . nRBC 10/16/2019 0.0  0.0 - 0.2 % Final  . Neutrophils Relative % 10/16/2019 52  % Final  . Neutro Abs 10/16/2019 3.4  1.7 - 7.7 K/uL Final  . Lymphocytes Relative 10/16/2019 34  % Final  . Lymphs Abs 10/16/2019 2.1  0.7 - 4.0 K/uL Final  . Monocytes Relative 10/16/2019 11  % Final  . Monocytes Absolute 10/16/2019 0.7  0.1 - 1.0 K/uL Final  . Eosinophils Relative 10/16/2019 2  % Final  . Eosinophils Absolute 10/16/2019 0.1  0.0 - 0.5 K/uL Final  . Basophils Relative 10/16/2019 1  % Final  . Basophils Absolute 10/16/2019 0.0  0.0 - 0.1 K/uL Final  . Immature Granulocytes 10/16/2019 0  % Final  . Abs Immature Granulocytes 10/16/2019 0.00  0.00 - 0.07 K/uL Final   Performed at [facility], [address] [place], [place] [address]    Assessment:  Emily Wallace is a 69 y.o. female withessential thrombocytosis (ET). Platelet counthas ranged between 64,000 - 590,000 for the past 4 years.  CBC on 12/13/2018revealed a hematocrit of 38.5, hemoglobin 13, MCV 86.7, platelets 590,000, white count 10,800 and ANC of 7600. Differential was unremarkable.  Work-up on 11/04/2017 revealed a hematocrit of 41.5, hemoglobin 14.2, MCV 87, platelets 573,000, WBC 9800 with an ANC of 6500. Differential was normal.  Ferritin was 31. Iron studies included a saturation of 25%  and TIBC of 293. Sed rate was 11. BCR-ABL was negative. JAK2 V617Fwas positive. von Willebrand panel was normal on 12/13/2017.  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 Reticulin and trichrome stains did not show significant increased in reticulin fibers or collagen deposition. Cytogenetics were normal (25, XX).   She began hydroxyurea500 mg/dayon 12/13/2017. Dose wasincreased to 500 mg BID on MWF and 500 mg daily on T,Th,S,S on 01/09/2018.Dose was increased to 500 mg BID on 02/06/2018.She is on a baby aspirin.  EGDon 07/25/2017 for epigastric pain and dysphasia revealed reflux esophagitis, a single gastric polyp (fundic gland polyp) normal duodenum. H. pylori and Barrett's were negative. Colonoscopyon 09/22/2017 revealed one small polyp in the mid transverse colon (tubular adenoma) and diverticulosis in the sigmoid colon.  She has afamily history of colon cancer(twin brother and paternal aunt).  She has received the second COVID-19 vaccine on 07/09/2019.  Symptomatically, she is doing well.  She denies any B symptoms.  Exam reveals no adenopathy or hepatosplenomegaly.  Plan: 1.   Labs today: CBC with diff, CMP. 2.JAK2 (+) MPN (Essential thrombocythemia) Symptomatically, she is doing well.  Exam is normal.   Platelets328,000.Platelet goal < 400,000. Continue hydroxyurea 500 mg p.o. twice daily. Continue a baby aspirin once a day. 3.Macrocytosis  Etiology secondary to hydroxyurea.  B12 was 713.  Folate 18.2 on 01/18/2019.  Check B12, folate and TSH at next visit. 4.   RTC in 3 months for MD assessment and labs (CBC with differential, CMP, B12, folate).  I discussed the assessment and treatment plan with the patient.  The patient was provided an opportunity to ask  questions and all were answered.  The patient agreed with the plan and demonstrated an understanding of the instructions.  The patient was advised to call back or seek an in person evaluation if the symptoms worsen or if the condition fails to improve as anticipated.   Nolon Stalls, MD, PhD  10/16/2019, 10:04 AM  I, Selena Batten, am acting as scribe for Chacra. Mike Gip, MD, PhD.  I, Mozes Sagar C. Mike Gip, MD, have reviewed the above documentation for accuracy and completeness, and I agree with the above.

## 2019-10-16 ENCOUNTER — Other Ambulatory Visit: Payer: Self-pay

## 2019-10-16 ENCOUNTER — Inpatient Hospital Stay: Payer: Medicare PPO

## 2019-10-16 ENCOUNTER — Inpatient Hospital Stay: Payer: Medicare PPO | Attending: Hematology and Oncology | Admitting: Hematology and Oncology

## 2019-10-16 ENCOUNTER — Encounter: Payer: Self-pay | Admitting: Hematology and Oncology

## 2019-10-16 VITALS — BP 112/78 | HR 73 | Temp 95.2°F | Resp 16 | Wt 202.1 lb

## 2019-10-16 DIAGNOSIS — D7589 Other specified diseases of blood and blood-forming organs: Secondary | ICD-10-CM | POA: Insufficient documentation

## 2019-10-16 DIAGNOSIS — D473 Essential (hemorrhagic) thrombocythemia: Secondary | ICD-10-CM | POA: Insufficient documentation

## 2019-10-16 LAB — CBC WITH DIFFERENTIAL/PLATELET
Abs Immature Granulocytes: 0 10*3/uL (ref 0.00–0.07)
Basophils Absolute: 0 10*3/uL (ref 0.0–0.1)
Basophils Relative: 1 %
Eosinophils Absolute: 0.1 10*3/uL (ref 0.0–0.5)
Eosinophils Relative: 2 %
HCT: 38.9 % (ref 36.0–46.0)
Hemoglobin: 13.2 g/dL (ref 12.0–15.0)
Immature Granulocytes: 0 %
Lymphocytes Relative: 34 %
Lymphs Abs: 2.1 10*3/uL (ref 0.7–4.0)
MCH: 35.9 pg — ABNORMAL HIGH (ref 26.0–34.0)
MCHC: 33.9 g/dL (ref 30.0–36.0)
MCV: 105.7 fL — ABNORMAL HIGH (ref 80.0–100.0)
Monocytes Absolute: 0.7 10*3/uL (ref 0.1–1.0)
Monocytes Relative: 11 %
Neutro Abs: 3.4 10*3/uL (ref 1.7–7.7)
Neutrophils Relative %: 52 %
Platelets: 328 10*3/uL (ref 150–400)
RBC: 3.68 MIL/uL — ABNORMAL LOW (ref 3.87–5.11)
RDW: 11.8 % (ref 11.5–15.5)
WBC: 6.4 10*3/uL (ref 4.0–10.5)
nRBC: 0 % (ref 0.0–0.2)

## 2019-10-16 LAB — COMPREHENSIVE METABOLIC PANEL
ALT: 16 U/L (ref 0–44)
AST: 18 U/L (ref 15–41)
Albumin: 4.2 g/dL (ref 3.5–5.0)
Alkaline Phosphatase: 62 U/L (ref 38–126)
Anion gap: 8 (ref 5–15)
BUN: 23 mg/dL (ref 8–23)
CO2: 26 mmol/L (ref 22–32)
Calcium: 9.3 mg/dL (ref 8.9–10.3)
Chloride: 102 mmol/L (ref 98–111)
Creatinine, Ser: 0.92 mg/dL (ref 0.44–1.00)
GFR calc Af Amer: >60 mL/min (ref >60)
GFR calc non Af Amer: >60 mL/min (ref >60)
Glucose, Bld: 87 mg/dL (ref 70–99)
Potassium: 3.7 mmol/L (ref 3.5–5.1)
Sodium: 136 mmol/L (ref 135–145)
Total Bilirubin: 0.8 mg/dL (ref 0.3–1.2)
Total Protein: 7.8 g/dL (ref 6.5–8.1)

## 2019-10-16 NOTE — Progress Notes (Signed)
Patient wants to know is it okay to take ibuprofen with hydroxyurea? She states that she get lightheaded from time to time, especially when going up her stairs at home. She takes meclizine occasionally when her sinuses affect her inner ear. She teaches water aerobics classes

## 2020-01-14 NOTE — Progress Notes (Signed)
[facility]  [address], [address] [place], [place] [address] Phone: [phone]  Fax: [phone]   Clinic Day:  01/15/2020  Referring physician: [place] Pink, MD  Chief Complaint: ASTOU LADA is a 69 y.o. 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  HPI: The patient was last seen in the hematology clinic on 10/16/2019. At that time, she was doing well.  She denied any B symptoms.  Exam revealed no adenopathy or hepatosplenomegaly. Hematocrit was 38.9, hemoglobin 13.2, platelets 328,000, WBC 6,400. CMP was normal. She continued hydroxyurea 500 mg po BID and baby aspirin daily.  During the interim, she has been "sleepy." She has eye pain due to allergies. She reports dizziness when she stands up and headaches. She has vomited three times during the interim after eating tomatoes and spicy foods. She had a blocked salivary gland and RSV. Denies any recent falls.   Past Medical History:  Diagnosis Date  . Anxiety   . GERD (gastroesophageal reflux disease)   . Hypertension   . Joint pain   . Vitamin D deficiency     Past Surgical History:  Procedure Laterality Date  . GALLBLADDER SURGERY      Family History  Problem Relation Age of Onset  . Colon cancer Brother   . Colon cancer Maternal Aunt   . Breast cancer Maternal Aunt   . Colon cancer Paternal Aunt   . Diabetes Maternal Grandmother   . Colon cancer Paternal Grandmother     Social History:  reports that she has never smoked. She has never used smokeless tobacco. No history on file for alcohol use and drug use. Patient smoked "3 cigarettes" the whole time while she was in college. She has rarely drinks alcohol. Patient is a retired Visual merchandiser. Patient denies known exposures to radiation on toxins. She lives in [place]. She has been married for 43 years.She has does water aerobics, and lifts weights. She cooks more. She  enjoys 1 hour and 30 minute massages. The patient is alone today.   Allergies:  Allergies  Allergen Reactions  . Codeine     hallucinations  . Codone [Hydrocodone] Rash    Current Medications: Current Outpatient Medications  Medication Sig Dispense Refill  . ACAI PO Take by mouth daily. 1000 mg daily    . Ascorbic Acid (VITAMIN C) 100 MG tablet Take 100 mg by mouth daily.    Marland Kitchen aspirin EC 81 MG tablet Take 81 mg by mouth daily.    Marland Kitchen b complex vitamins capsule Take 1 capsule by mouth daily.    . Calcium-Magnesium-Zinc (CAL-MAG-ZINC PO) Take by mouth.    . esomeprazole (NEXIUM) 40 MG capsule Take 40 mg by mouth daily at 12 noon.    . famotidine (PEPCID) 40 MG tablet Take 40 mg by mouth daily.    . hydroxyurea (HYDREA) 500 MG capsule TAKE 1 CAPSULE(500 MG) BY MOUTH TWICE DAILY. MAY TAKE WITH FOOD TO MINIMIZE GI SIDE EFFECTS 60 capsule 1  . losartan (COZAAR) 50 MG tablet Take 50 mg by mouth daily.    . sertraline (ZOLOFT) 100 MG tablet Take 100 mg by mouth daily.    . TURMERIC PO Take by mouth daily. 1500 mg daily    . VITAMIN D, ERGOCALCIFEROL, PO Take 5,000 Units by mouth daily.    Marland Kitchen azelastine (ASTELIN) 0.1 % nasal spray  (Patient not taking: Reported on 01/15/2020)     No current facility-administered medications  for this visit.     Review of Systems  Constitutional: Negative.  Negative for chills, diaphoresis, fever, malaise/fatigue and weight loss (stable).       Feels "sleepy."  HENT: Negative.  Negative for congestion, ear discharge, ear pain, hearing loss, nosebleeds, sinus pain, sore throat and tinnitus.   Eyes: Positive for pain (due to allergies). Negative for blurred vision and double vision.  Respiratory: Negative for cough, hemoptysis, sputum production and shortness of breath.        Had RSV  Cardiovascular: Negative.  Negative for chest pain, palpitations, orthopnea, leg swelling and PND.  Gastrointestinal: Positive for heartburn (on PPI therapy- overall better;  depends on what she eats) and vomiting (3 episodes after eating tomatoes and spicy food). Negative for abdominal pain, blood in stool, constipation, diarrhea, melena and nausea.  Genitourinary: Negative.  Negative for dysuria, frequency, hematuria and urgency.  Musculoskeletal: Negative for back pain, falls, joint pain (arthritis), myalgias and neck pain.  Skin: Negative.  Negative for itching and rash.  Neurological: Positive for dizziness (upon standing) and headaches. Negative for tremors, sensory change, speech change, focal weakness and weakness.       Lightheaded at times.  Endo/Heme/Allergies: Positive for environmental allergies. Does not bruise/bleed easily.  Psychiatric/Behavioral: Negative.  Negative for depression and memory loss. The patient is not nervous/anxious and does not have insomnia.   All other systems reviewed and are negative.   Performance status (ECOG): 1  Vitals: Blood pressure 135/74, pulse 76, temperature (!) 96.4 F (35.8 C), temperature source Tympanic, resp. rate 18, weight 202 lb 13.2 oz (92 kg), SpO2 99 %.  Physical Exam Vitals and nursing note reviewed.  Constitutional:      General: She is not in acute distress.    Appearance: She is well-developed. She is not diaphoretic.  HENT:     Head: Normocephalic and atraumatic.     Comments: Gray hair.    Mouth/Throat:     Pharynx: No oropharyngeal exudate.  Eyes:     General: No scleral icterus.    Conjunctiva/sclera: Conjunctivae normal.     Pupils: Pupils are equal, round, and reactive to light.     Comments: Blue eyes.  Cardiovascular:     Rate and Rhythm: Normal rate and regular rhythm.     Heart sounds: Normal heart sounds. No murmur heard.   Pulmonary:     Effort: Pulmonary effort is normal. No respiratory distress.     Breath sounds: Normal breath sounds. No wheezing or rales.  Chest:     Chest wall: No tenderness.  Abdominal:     General: Bowel sounds are normal. There is no distension.      Palpations: Abdomen is soft. There is no mass.     Tenderness: There is no abdominal tenderness. There is no guarding or rebound.  Musculoskeletal:        General: No tenderness. Normal range of motion.     Cervical back: Normal range of motion and neck supple.  Lymphadenopathy:     Head:     Right side of head: No preauricular, posterior auricular or occipital adenopathy.     Left side of head: No preauricular, posterior auricular or occipital adenopathy.     Cervical: No cervical adenopathy.     Upper Body:     Right upper body: No supraclavicular or axillary adenopathy.     Left upper body: No supraclavicular or axillary adenopathy.     Lower Body: No right inguinal adenopathy. No left  inguinal adenopathy.  Skin:    General: Skin is warm and dry.  Neurological:     Mental Status: She is alert and oriented to person, place, and time.  Psychiatric:        Behavior: Behavior normal.        Thought Content: Thought content normal.        Judgment: Judgment normal.    Appointment on 01/15/2020  Component Date Value Ref Range Status  . Sodium 01/15/2020 139  135 - 145 mmol/L Final  . Potassium 01/15/2020 4.0  3.5 - 5.1 mmol/L Final  . Chloride 01/15/2020 102  98 - 111 mmol/L Final  . CO2 01/15/2020 27  22 - 32 mmol/L Final  . Glucose, Bld 01/15/2020 98  70 - 99 mg/dL Final   Glucose reference range applies only to samples taken after fasting for at least 8 hours.  . BUN 01/15/2020 17  8 - 23 mg/dL Final  . Creatinine, Ser 01/15/2020 0.86  0.44 - 1.00 mg/dL Final  . Calcium 01/15/2020 9.8  8.9 - 10.3 mg/dL Final  . Total Protein 01/15/2020 7.8  6.5 - 8.1 g/dL Final  . Albumin 01/15/2020 3.9  3.5 - 5.0 g/dL Final  . AST 01/15/2020 16  15 - 41 U/L Final  . ALT 01/15/2020 16  0 - 44 U/L Final  . Alkaline Phosphatase 01/15/2020 65  38 - 126 U/L Final  . Total Bilirubin 01/15/2020 0.7  0.3 - 1.2 mg/dL Final  . GFR calc non Af Amer 01/15/2020 >60  >60 mL/min Final  . GFR calc Af Amer  01/15/2020 >60  >60 mL/min Final  . Anion gap 01/15/2020 10  5 - 15 Final   Performed at [facility], [address] [place], [place] [address]  . WBC 01/15/2020 7.5  4.0 - 10.5 K/uL Final  . RBC 01/15/2020 3.74* 3.87 - 5.11 MIL/uL Final  . Hemoglobin 01/15/2020 13.4  12.0 - 15.0 g/dL Final  . HCT 01/15/2020 38.9  36 - 46 % Final  . MCV 01/15/2020 104.0* 80.0 - 100.0 fL Final  . MCH 01/15/2020 35.8* 26.0 - 34.0 pg Final  . MCHC 01/15/2020 34.4  30.0 - 36.0 g/dL Final  . RDW 01/15/2020 12.2  11.5 - 15.5 % Final  . Platelets 01/15/2020 338  150 - 400 K/uL Final  . nRBC 01/15/2020 0.0  0.0 - 0.2 % Final  . Neutrophils Relative % 01/15/2020 55  % Final  . Neutro Abs 01/15/2020 4.2  1.7 - 7.7 K/uL Final  . Lymphocytes Relative 01/15/2020 31  % Final  . Lymphs Abs 01/15/2020 2.3  0.7 - 4.0 K/uL Final  . Monocytes Relative 01/15/2020 11  % Final  . Monocytes Absolute 01/15/2020 0.8  0 - 1 K/uL Final  . Eosinophils Relative 01/15/2020 2  % Final  . Eosinophils Absolute 01/15/2020 0.1  0 - 0 K/uL Final  . Basophils Relative 01/15/2020 1  % Final  . Basophils Absolute 01/15/2020 0.0  0 - 0 K/uL Final  . Immature Granulocytes 01/15/2020 0  % Final  . Abs Immature Granulocytes 01/15/2020 0.01  0.00 - 0.07 K/uL Final   Performed at [facility], [address], [place], [place] [address]    Assessment:  Emily Wallace is a 69 y.o. female withessential thrombocytosis (ET). Platelet counthas ranged between 70,000 - 590,000 for the past 4 years.  CBC on 12/13/2018revealed a hematocrit of 38.5, hemoglobin 13, MCV 86.7, platelets 590,000, white count  10,800 and ANC of 7600. Differential was unremarkable.  Work-up on 11/04/2017 revealed a hematocrit of 41.5, hemoglobin 14.2, MCV 87, platelets 573,000, WBC 9800 with an ANC of 6500. Differential was normal. Ferritin was 31. Iron studies included a saturation of 25% and TIBC of 293. Sed rate was 11. BCR-ABL was  negative. JAK2 V617Fwas positive. von Willebrand panel was normal on 12/13/2017.  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 Reticulin and trichrome stains did not show significant increased in reticulin fibers or collagen deposition. Cytogenetics were normal (69, XX).   She began hydroxyurea500 mg/dayon 12/13/2017. Dose wasincreased to 500 mg BID on MWF and 500 mg daily on T,Th,S,S on 01/09/2018.Dose was increased to 500 mg BID on 02/06/2018.She is on a baby aspirin.  EGDon 07/25/2017 for epigastric pain and dysphasia revealed reflux esophagitis, a single gastric polyp (fundic gland polyp) normal duodenum. H. pylori and Barrett's were negative. Colonoscopyon 09/22/2017 revealed one small polyp in the mid transverse colon (tubular adenoma) and diverticulosis in the sigmoid colon.  She has afamily history of colon cancer(twin brother and paternal aunt).  She has received the second COVID-19 vaccine on 07/09/2019.  Symptomatically, She denies any fevers, sweats or weight loss.  She has been sleepy and dizzy when standing.  Exam reveals no adenopathy or hepatosplenomegaly.  Plan: 1.   Labs today: CBC with differential, CMP, B12, folate. 2.JAK2 (+) MPN (Essential thrombocythemia) Symptomatically, she appears to be doing well.  Exam is normal.   Platelets338,000.Platelet goal < 400,000. Continue hydroxyurea 500 mg p.o. twice daily. Continue baby aspirin once a day. 3.Macrocytosis  MCV is 104.0   Etiology secondary to hydroxyurea.  B12 is 653 and folate 12.6 today.  Check annually. 4.   RTC in 3 months for MD assessment and labs (CBC with diff, CMP).  I discussed the assessment and treatment plan with the patient.  The patient was provided an opportunity to ask questions and all were answered.  The patient agreed with the plan and  demonstrated an understanding of the instructions.  The patient was advised to call back or seek an in person evaluation if the symptoms worsen or if the condition fails to improve as anticipated.   Nolon Stalls, MD, PhD  01/15/2020, 10:52 AM  I, Mirian Mo Tufford, am acting as Education administrator for [employer]. Mike Gip, MD, PhD.  I, Jessilyn Catino C. Mike Gip, MD, have reviewed the above documentation for accuracy and completeness, and I agree with the above.

## 2020-01-15 ENCOUNTER — Encounter: Payer: Self-pay | Admitting: Hematology and Oncology

## 2020-01-15 ENCOUNTER — Inpatient Hospital Stay: Payer: Medicare PPO | Admitting: Hematology and Oncology

## 2020-01-15 ENCOUNTER — Other Ambulatory Visit: Payer: Self-pay

## 2020-01-15 ENCOUNTER — Inpatient Hospital Stay: Payer: Medicare PPO | Attending: Hematology and Oncology

## 2020-01-15 VITALS — BP 135/74 | HR 76 | Temp 96.4°F | Resp 18 | Wt 202.8 lb

## 2020-01-15 DIAGNOSIS — Z7982 Long term (current) use of aspirin: Secondary | ICD-10-CM | POA: Insufficient documentation

## 2020-01-15 DIAGNOSIS — D7589 Other specified diseases of blood and blood-forming organs: Secondary | ICD-10-CM | POA: Insufficient documentation

## 2020-01-15 DIAGNOSIS — D473 Essential (hemorrhagic) thrombocythemia: Secondary | ICD-10-CM

## 2020-01-15 LAB — COMPREHENSIVE METABOLIC PANEL
ALT: 16 U/L (ref 0–44)
AST: 16 U/L (ref 15–41)
Albumin: 3.9 g/dL (ref 3.5–5.0)
Alkaline Phosphatase: 65 U/L (ref 38–126)
Anion gap: 10 (ref 5–15)
BUN: 17 mg/dL (ref 8–23)
CO2: 27 mmol/L (ref 22–32)
Calcium: 9.8 mg/dL (ref 8.9–10.3)
Chloride: 102 mmol/L (ref 98–111)
Creatinine, Ser: 0.86 mg/dL (ref 0.44–1.00)
GFR calc Af Amer: >60 mL/min (ref >60)
GFR calc non Af Amer: >60 mL/min (ref >60)
Glucose, Bld: 98 mg/dL (ref 70–99)
Potassium: 4 mmol/L (ref 3.5–5.1)
Sodium: 139 mmol/L (ref 135–145)
Total Bilirubin: 0.7 mg/dL (ref 0.3–1.2)
Total Protein: 7.8 g/dL (ref 6.5–8.1)

## 2020-01-15 LAB — CBC WITH DIFFERENTIAL/PLATELET
Abs Immature Granulocytes: 0.01 10*3/uL (ref 0.00–0.07)
Basophils Absolute: 0 10*3/uL (ref 0.0–0.1)
Basophils Relative: 1 %
Eosinophils Absolute: 0.1 10*3/uL (ref 0.0–0.5)
Eosinophils Relative: 2 %
HCT: 38.9 % (ref 36.0–46.0)
Hemoglobin: 13.4 g/dL (ref 12.0–15.0)
Immature Granulocytes: 0 %
Lymphocytes Relative: 31 %
Lymphs Abs: 2.3 10*3/uL (ref 0.7–4.0)
MCH: 35.8 pg — ABNORMAL HIGH (ref 26.0–34.0)
MCHC: 34.4 g/dL (ref 30.0–36.0)
MCV: 104 fL — ABNORMAL HIGH (ref 80.0–100.0)
Monocytes Absolute: 0.8 10*3/uL (ref 0.1–1.0)
Monocytes Relative: 11 %
Neutro Abs: 4.2 10*3/uL (ref 1.7–7.7)
Neutrophils Relative %: 55 %
Platelets: 338 10*3/uL (ref 150–400)
RBC: 3.74 MIL/uL — ABNORMAL LOW (ref 3.87–5.11)
RDW: 12.2 % (ref 11.5–15.5)
WBC: 7.5 10*3/uL (ref 4.0–10.5)
nRBC: 0 % (ref 0.0–0.2)

## 2020-01-15 LAB — VITAMIN B12: Vitamin B-12: 653 pg/mL (ref 180–914)

## 2020-01-15 LAB — FOLATE: Folate: 12.6 ng/mL (ref >5.9)

## 2020-01-15 NOTE — Progress Notes (Signed)
Vomited twice after eating [place] food

## 2020-01-16 ENCOUNTER — Ambulatory Visit: Payer: Medicare PPO | Admitting: Hematology and Oncology

## 2020-01-16 ENCOUNTER — Other Ambulatory Visit: Payer: Medicare PPO

## 2020-01-23 IMAGING — CT CT BIOPSY
2 series · 10 of 14 positions shown, 12 images · non-contrast
Comparison: none

INDICATION: Thrombocytosis

[Series 2: i-spiral 5.0 b30f · axial · 0.73mm/px · z∈[-150,-88]mm · 7 of 25 slices shown, 9 images]
[im 4/25  soft-tissue]
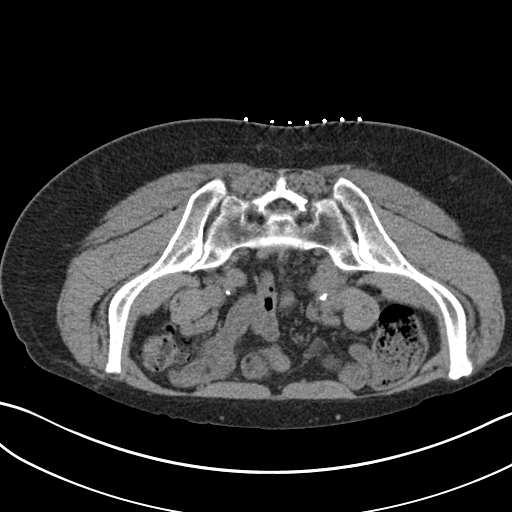
[im 4/25  bone]
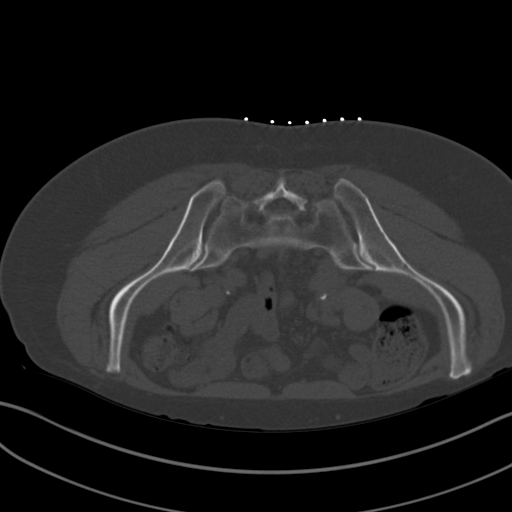
[im 7/25  bone]
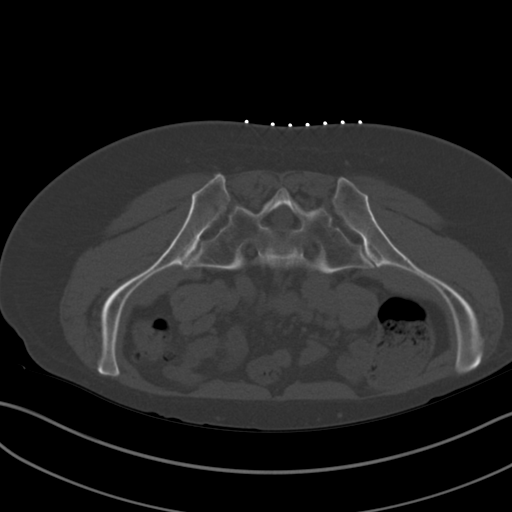
[im 10/25  bone]
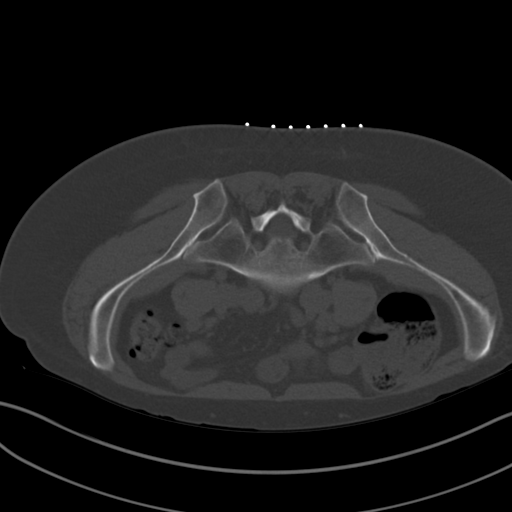
[im 13/25  bone]
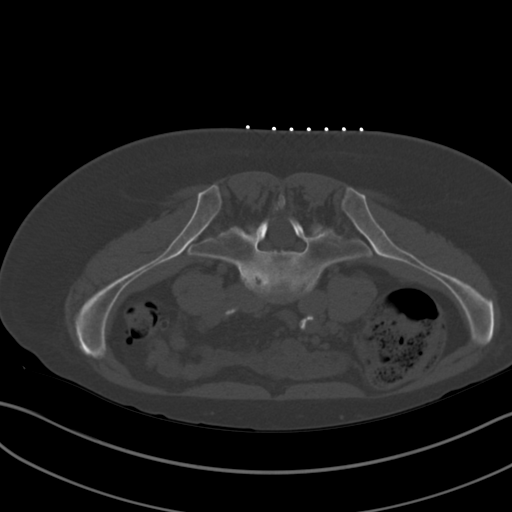
[im 16/25  soft-tissue]
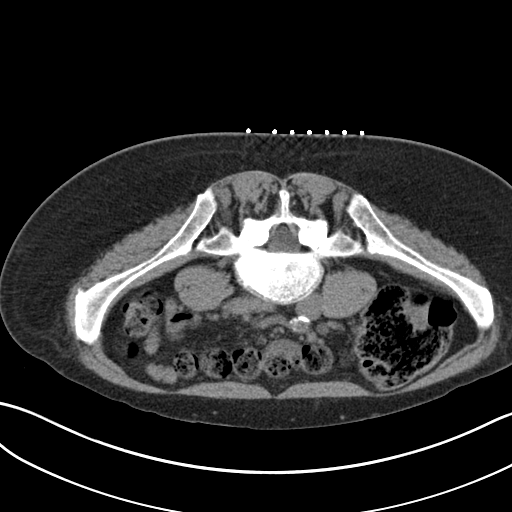
[im 16/25  bone]
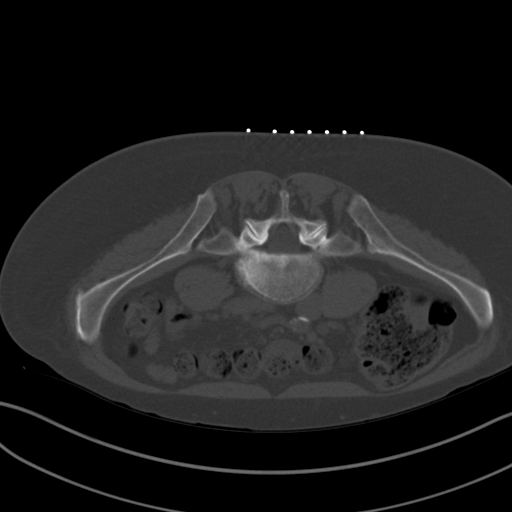
[im 19/25  bone]
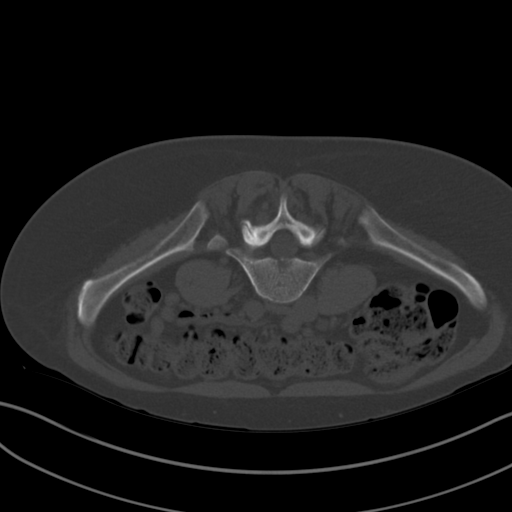
[im 22/25  bone]
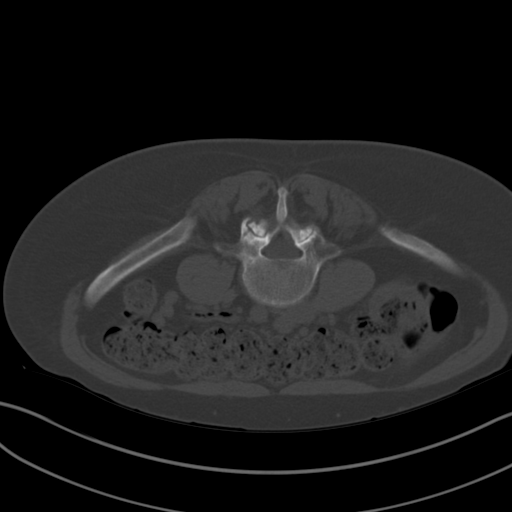

[Series 3: i-sequence 4.8 b30s · axial · 0.73mm/px · z∈[-141,-132]mm · 3 of 12 slices shown]
[im 3/12  bone]
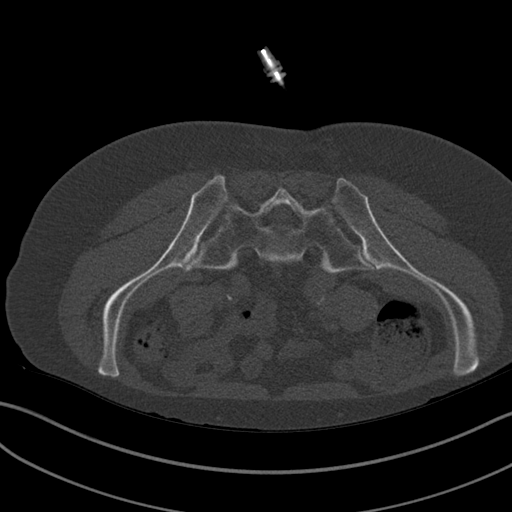
[im 6/12  bone]
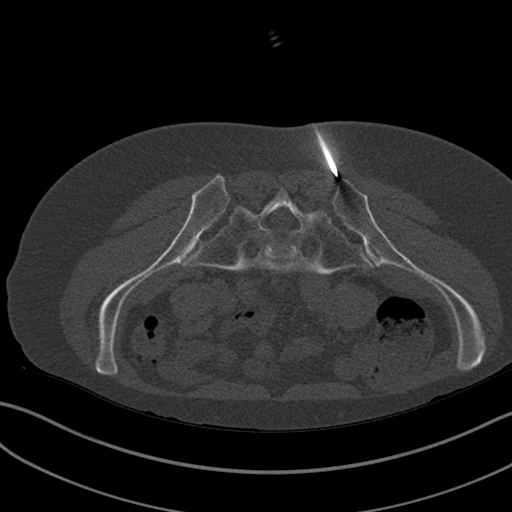
[im 9/12  bone]
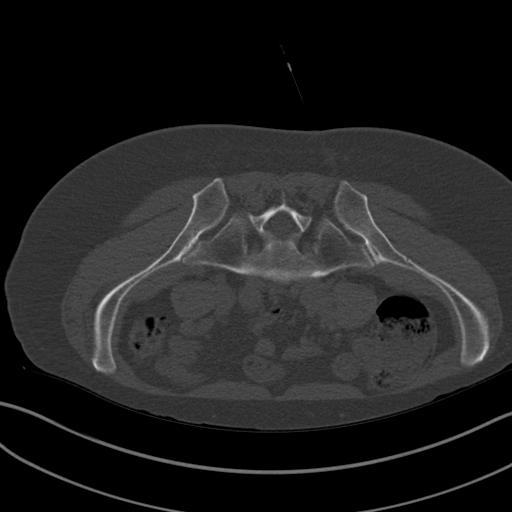

[10 of 14 positions shown; findings below may reference images not displayed]

EXAM:
CT BIOPSY

MEDICATIONS:
None.

ANESTHESIA/SEDATION:
Fentanyl 100 mcg IV; Versed 2 mg IV

Moderate Sedation Time:  10 minutes

The patient was continuously monitored during the procedure by the
interventional radiology nurse under my direct supervision.

FLUOROSCOPY TIME:  Fluoroscopy Time:  minutes  seconds ( mGy).

COMPLICATIONS:
None immediate.

PROCEDURE:
Informed written consent was obtained from the patient after a
thorough discussion of the procedural risks, benefits and
alternatives. All questions were addressed. Maximal Sterile Barrier
Technique was utilized including caps, mask, sterile gowns, sterile
gloves, sterile drape, hand hygiene and skin antiseptic. A timeout
was performed prior to the initiation of the procedure.

Under CT guidance, a(n) 17 gauge guide needle was advanced into the
right iliac bone. Subsequently, aspirates and a core were obtained.
Post biopsy images demonstrate no hemorrhage.

Patient tolerated the procedure well without complication. Vital
sign monitoring by nursing staff during the procedure will continue
as patient is in the special procedures unit for post procedure
observation.
FINDINGS: The images document guide needle placement within the right iliac
bone. Post biopsy images demonstrate no hemorrhage.
IMPRESSION: Successful CT-guided bone marrow aspirate and core.

## 2020-03-05 ENCOUNTER — Other Ambulatory Visit: Payer: Medicare PPO

## 2020-03-05 DIAGNOSIS — Z20822 Contact with and (suspected) exposure to covid-19: Secondary | ICD-10-CM

## 2020-03-06 LAB — SARS-COV-2, NAA 2 DAY TAT

## 2020-03-06 LAB — NOVEL CORONAVIRUS, NAA: SARS-CoV-2, NAA: NOT DETECTED

## 2020-03-17 ENCOUNTER — Emergency Department: Payer: Medicare PPO

## 2020-03-17 ENCOUNTER — Other Ambulatory Visit: Payer: Self-pay

## 2020-03-17 ENCOUNTER — Encounter: Payer: Self-pay | Admitting: Emergency Medicine

## 2020-03-17 ENCOUNTER — Other Ambulatory Visit: Payer: Medicare PPO

## 2020-03-17 ENCOUNTER — Emergency Department
Admission: EM | Admit: 2020-03-17 | Discharge: 2020-03-17 | Disposition: A | Discharge status: Home / Self Care | Payer: Medicare PPO | Attending: Emergency Medicine | Admitting: Emergency Medicine

## 2020-03-17 DIAGNOSIS — R079 Chest pain, unspecified: Secondary | ICD-10-CM

## 2020-03-17 DIAGNOSIS — I10 Essential (primary) hypertension: Secondary | ICD-10-CM | POA: Diagnosis not present

## 2020-03-17 DIAGNOSIS — Z79899 Other long term (current) drug therapy: Secondary | ICD-10-CM | POA: Diagnosis not present

## 2020-03-17 DIAGNOSIS — Z7982 Long term (current) use of aspirin: Secondary | ICD-10-CM | POA: Diagnosis not present

## 2020-03-17 LAB — BASIC METABOLIC PANEL
Anion gap: 9 (ref 5–15)
BUN: 15 mg/dL (ref 8–23)
CO2: 26 mmol/L (ref 22–32)
Calcium: 10 mg/dL (ref 8.9–10.3)
Chloride: 104 mmol/L (ref 98–111)
Creatinine, Ser: 0.94 mg/dL (ref 0.44–1.00)
GFR, Estimated: >60 mL/min (ref >60)
Glucose, Bld: 111 mg/dL — ABNORMAL HIGH (ref 70–99)
Potassium: 3.6 mmol/L (ref 3.5–5.1)
Sodium: 139 mmol/L (ref 135–145)

## 2020-03-17 LAB — CBC
HCT: 39.7 % (ref 36.0–46.0)
Hemoglobin: 13.9 g/dL (ref 12.0–15.0)
MCH: 37.1 pg — ABNORMAL HIGH (ref 26.0–34.0)
MCHC: 35 g/dL (ref 30.0–36.0)
MCV: 105.9 fL — ABNORMAL HIGH (ref 80.0–100.0)
Platelets: 299 10*3/uL (ref 150–400)
RBC: 3.75 MIL/uL — ABNORMAL LOW (ref 3.87–5.11)
RDW: 12.6 % (ref 11.5–15.5)
WBC: 5.7 10*3/uL (ref 4.0–10.5)
nRBC: 0 % (ref 0.0–0.2)

## 2020-03-17 LAB — HEPATIC FUNCTION PANEL
ALT: 27 U/L (ref 0–44)
AST: 24 U/L (ref 15–41)
Albumin: 4.3 g/dL (ref 3.5–5.0)
Alkaline Phosphatase: 64 U/L (ref 38–126)
Bilirubin, Direct: <0.1 mg/dL (ref 0.0–0.2)
Total Bilirubin: 0.8 mg/dL (ref 0.3–1.2)
Total Protein: 7.9 g/dL (ref 6.5–8.1)

## 2020-03-17 LAB — FIBRIN DERIVATIVES D-DIMER (ARMC ONLY): Fibrin derivatives D-dimer (ARMC): 543.19 ng/mL (FEU) — ABNORMAL HIGH (ref 0.00–499.00)

## 2020-03-17 LAB — LIPASE, BLOOD: Lipase: 28 U/L (ref 11–51)

## 2020-03-17 LAB — TROPONIN I (HIGH SENSITIVITY): Troponin I (High Sensitivity): 11 ng/L (ref <18)

## 2020-03-17 MED ORDER — LORAZEPAM 2 MG/ML IJ SOLN
1.0000 mg | Freq: Once | INTRAMUSCULAR | Status: AC
  Administered 2020-03-17: 1 mg via INTRAVENOUS
  Filled 2020-03-17: qty 1

## 2020-03-17 MED ORDER — IOHEXOL 350 MG/ML SOLN
75.0000 mL | Freq: Once | INTRAVENOUS | Status: AC | PRN
  Administered 2020-03-17: 75 mL via INTRAVENOUS

## 2020-03-17 NOTE — ED Provider Notes (Signed)
[facility] Emergency Department Provider Note  Time seen: 9:24 AM  I have reviewed the triage vital signs and the nursing notes.   HISTORY  Chief Complaint Chest Pain   HPI Emily Wallace is a 69 y.o. female with a past medical history anxiety, gastric reflux, hypertension, presents to the emergency department for chest pain.  According to the patient last night developed pain under her shoulder blade, she thought she might have pulled a muscle, but overnight developed small amount of pain in her epigastrium.  States the pain is since resolved but she continues to have mild chest tightness.  She also states she has significant gastric reflux and thinks it could have just been reflux but she wanted to be safe to make sure she is not had a heart attack so she went to [place] clinic.  [place] [facility] sent the patient to the emergency department.  Patient denies any fever significant cough or shortness of breath.  Patient has had her booster shot for Covid.   Past Medical History:  Diagnosis Date  . Anxiety   . GERD (gastroesophageal reflux disease)   . Hypertension   . Joint pain   . Vitamin D deficiency     Patient Active Problem List   Diagnosis Date Noted  . Macrocytosis 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 10/18/2018  . Goals of care, counseling/discussion 11/18/2017  . Essential thrombocytosis ([place]) 11/04/2017    Past Surgical History:  Procedure Laterality Date  . GALLBLADDER SURGERY      Prior to Admission medications   Medication Sig Start Date End Date Taking? Authorizing Provider  ACAI PO Take by mouth daily. 1000 mg daily    [provider]  Ascorbic Acid (VITAMIN C) 100 MG tablet Take 100 mg by mouth daily.    [provider]  aspirin EC 81 MG tablet Take 81 mg by mouth daily.    [provider]  azelastine (ASTELIN) 0.1 % nasal spray  09/14/19   [provider]  b complex vitamins capsule Take 1 capsule by mouth daily.     [provider]  Calcium-Magnesium-Zinc (CAL-MAG-ZINC PO) Take by mouth.    [provider]  esomeprazole (NEXIUM) 40 MG capsule Take 40 mg by mouth daily at 12 noon.    [provider]  famotidine (PEPCID) 40 MG tablet Take 40 mg by mouth daily.    [provider]  hydroxyurea (HYDREA) 500 MG capsule TAKE 1 CAPSULE(500 MG) BY MOUTH TWICE DAILY. MAY TAKE WITH FOOD TO MINIMIZE GI SIDE EFFECTS 07/16/19   Lequita Asal, MD  losartan (COZAAR) 50 MG tablet Take 50 mg by mouth daily.    [provider]  sertraline (ZOLOFT) 100 MG tablet Take 100 mg by mouth daily.    [provider]  TURMERIC PO Take by mouth daily. 1500 mg daily    [provider]  VITAMIN D, ERGOCALCIFEROL, PO Take 5,000 Units by mouth daily.    [provider]    Allergies  Allergen Reactions  . Codeine     hallucinations  . Codone [Hydrocodone] Rash    Family History  Problem Relation Age of Onset  . Colon cancer Brother   . Colon cancer Maternal Aunt   . Breast cancer Maternal Aunt   . Colon cancer Paternal Aunt   . Diabetes Maternal Grandmother   . Colon cancer Paternal Grandmother     Social History Social History   Tobacco Use  . Smoking status: Never Smoker  .  Smokeless tobacco: Never Used  Vaping Use  . Vaping Use: Never used  Substance Use Topics  . Alcohol use: Yes  . Drug use: Never    Review of Systems Constitutional: Negative for fever. Cardiovascular: Positive for chest tightness.  Last night had pain under her shoulder blade. Respiratory: Negative for shortness of breath. Gastrointestinal: Mild epigastric discomfort now resolved. Genitourinary: Negative for urinary compaints Musculoskeletal: Negative for musculoskeletal complaints Neurological: Negative for headache All other ROS negative  ____________________________________________   PHYSICAL EXAM:  VITAL SIGNS: ED Triage Vitals  Enc Vitals Group     BP  03/17/20 0832 (!) 152/101     Pulse Rate 03/17/20 0832 100     Resp 03/17/20 0832 16     Temp 03/17/20 0832 98.8 F (37.1 C)     Temp Source 03/17/20 0832 Oral     SpO2 03/17/20 0832 96 %     Weight 03/17/20 0840 205 lb (93 kg)     Height 03/17/20 0840 5\' 9"  (1.753 m)     Head Circumference --      Peak Flow --      Pain Score 03/17/20 0839 3     Pain Loc --      Pain Edu? --      Excl. in [place]? --     Constitutional: Alert and oriented. Well appearing and in no distress. Eyes: Normal exam ENT      Head: Normocephalic and atraumatic.      Mouth/Throat: Mucous membranes are moist. Cardiovascular: Normal rate, regular rhythm. Respiratory: Normal respiratory effort without tachypnea nor retractions. Breath sounds are clear  Gastrointestinal: Soft and nontender. No distention.  Musculoskeletal: Nontender with normal range of motion in all extremities. Neurologic:  Normal speech and language. No gross focal neurologic deficits  Skin:  Skin is warm, dry and intact.  Psychiatric: Mood and affect are normal.   ____________________________________________    EKG  EKG viewed and interpreted by myself shows sinus tachycardia 102 bpm with a narrow QRS, left axis deviation, largely normal intervals with nonspecific ST changes.  ____________________________________________    RADIOLOGY  CTA is negative.  Patient is a non-smoker, nor prior smoker, no follow-up required.  ____________________________________________   INITIAL IMPRESSION / ASSESSMENT AND PLAN / ED COURSE  Pertinent labs & imaging results that were available during my care of the patient were reviewed by me and considered in my medical decision making (see chart for details).   Patient presents emergency department for chest pain and epigastric discomfort mostly last night with mild chest tightness today.  Patient has a history of gastric reflux as well as anxiety but states she wanted to be sure that she did not have  a heart attack.  Overall the patient appears well with a reassuring physical exam no abdominal tenderness currently.  Lab work is overall reassuring as well including a negative troponin.  However given the location of her discomfort I have added on a hepatic function panel and a lipase.  Patient is status post cholecystectomy.  Patient does admit to significant reflux.  Given the pain under the shoulder blade last night I have also added on a D-dimer as precaution.  CTA is negative for acute abnormality.  Patient continues to appear well in the emergency department.  Overall reassuring work-up including negative troponin.  We will discharge patient with PCP follow-up.  Emily Wallace was evaluated in Emergency Department on 03/17/2020 for the symptoms described in the history of present illness. She  was evaluated in the context of the global COVID-19 pandemic, which necessitated consideration that the patient might be at risk for infection with the SARS-CoV-2 virus that causes COVID-19. Institutional protocols and algorithms that pertain to the evaluation of patients at risk for COVID-19 are in a state of rapid change based on information released by regulatory bodies including the CDC and federal and state organizations. These policies and algorithms were followed during the patient's care in the ED.  ____________________________________________   FINAL CLINICAL IMPRESSION(S) / ED DIAGNOSES  Chest pain   Harvest Dark, MD 03/17/20 1416

## 2020-03-17 NOTE — ED Triage Notes (Signed)
Pt via POV coming from [facility]. Pt states that she was having pain under her shoulder blade yesterday. Last night she was having cold chills, centralized chest pressure started last night and went into this morning. Pt was seen at [facility] and she was sent here. Pt is A&Ox4 and NAD.

## 2020-03-17 NOTE — ED Notes (Signed)
E-signature not working at this time. Pt verbalized understanding of D/C instructions, prescriptions and follow up care with no further questions at this time. Pt in NAD and ambulatory at time of D/C.  

## 2020-03-18 ENCOUNTER — Encounter: Payer: Self-pay | Admitting: Hematology and Oncology

## 2020-03-19 ENCOUNTER — Other Ambulatory Visit: Payer: Medicare PPO

## 2020-03-19 DIAGNOSIS — Z20822 Contact with and (suspected) exposure to covid-19: Secondary | ICD-10-CM

## 2020-03-20 ENCOUNTER — Other Ambulatory Visit: Payer: Self-pay

## 2020-03-20 ENCOUNTER — Ambulatory Visit (INDEPENDENT_AMBULATORY_CARE_PROVIDER_SITE_OTHER): Payer: Medicare PPO | Admitting: Dermatology

## 2020-03-20 DIAGNOSIS — D1800 Hemangioma unspecified site: Secondary | ICD-10-CM

## 2020-03-20 DIAGNOSIS — L821 Other seborrheic keratosis: Secondary | ICD-10-CM | POA: Diagnosis not present

## 2020-03-20 DIAGNOSIS — D2371 Other benign neoplasm of skin of right lower limb, including hip: Secondary | ICD-10-CM | POA: Diagnosis not present

## 2020-03-20 DIAGNOSIS — Z1283 Encounter for screening for malignant neoplasm of skin: Secondary | ICD-10-CM | POA: Diagnosis not present

## 2020-03-20 DIAGNOSIS — L578 Other skin changes due to chronic exposure to nonionizing radiation: Secondary | ICD-10-CM

## 2020-03-20 DIAGNOSIS — D229 Melanocytic nevi, unspecified: Secondary | ICD-10-CM | POA: Diagnosis not present

## 2020-03-20 DIAGNOSIS — D492 Neoplasm of unspecified behavior of bone, soft tissue, and skin: Secondary | ICD-10-CM

## 2020-03-20 DIAGNOSIS — L814 Other melanin hyperpigmentation: Secondary | ICD-10-CM | POA: Diagnosis not present

## 2020-03-20 DIAGNOSIS — D692 Other nonthrombocytopenic purpura: Secondary | ICD-10-CM

## 2020-03-20 NOTE — Progress Notes (Signed)
   Follow-Up Visit   Subjective  Emily Wallace is a 69 y.o. female who presents for the following: Annual Exam (1 year f/u TBSE, no history of skin cancer ) and Skin Problem (Pt concerned about spot on the R thigh growing and changing color ).Pt was born and raised in [place] and has had lots of sun exposure.. The patient presents for Total-Body Skin Exam (TBSE) for skin cancer screening and mole check.  The following portions of the chart were reviewed this encounter and updated as appropriate:  Tobacco  Allergies  Meds  Problems  Med Hx  Surg Hx  Fam Hx     Review of Systems:  No other skin or systemic complaints except as noted in HPI or Assessment and Plan.  Objective  Well appearing patient in no apparent distress; mood and affect are within normal limits.  A full examination was performed including scalp, head, eyes, ears, nose, lips, neck, chest, axillae, abdomen, back, buttocks, bilateral upper extremities, bilateral lower extremities, hands, feet, fingers, toes, fingernails, and toenails. All findings within normal limits unless otherwise noted below.  Objective  Right prox ant lat thigh: 2.1 x 1.1 pink patch    Assessment & Plan  Neoplasm of skin Right prox ant lat thigh  Skin / nail biopsy Type of biopsy: tangential   Informed consent: discussed and consent obtained   Patient was prepped and draped in usual sterile fashion: area prepped with alochol. Anesthesia: the lesion was anesthetized in a standard fashion   Anesthetic:  1% lidocaine w/ epinephrine 1-100,000 buffered w/ 8.4% NaHCO3 Instrument used: flexible razor blade   Hemostasis achieved with: pressure, aluminum chloride and electrodesiccation   Outcome: patient tolerated procedure well   Post-procedure details: wound care instructions given   Post-procedure details comment:  Ointment and small bandage  Specimen 1 - Surgical pathology Differential Diagnosis: Irritated ISK vs Other Check Margins:  No 2.1 x 1.1 pink patch  Skin cancer screening   Lentigines - Scattered tan macules - Discussed due to sun exposure - Benign, observe - Call for any changes  Seborrheic Keratoses - Stuck-on, waxy, tan-brown papules and plaques  - Discussed benign etiology and prognosis. - Observe - Call for any changes  Melanocytic Nevi - Tan-brown and/or pink-flesh-colored symmetric macules and papules - Benign appearing on exam today - Observation - Call clinic for new or changing moles - Recommend daily use of broad spectrum spf 30+ sunscreen to sun-exposed areas.   Hemangiomas - Red papules - Discussed benign nature - Observe - Call for any changes  Actinic Damage -chronic - diffuse scaly erythematous macules with underlying dyspigmentation - Recommend daily broad spectrum sunscreen SPF 30+ to sun-exposed areas, reapply every 2 hours as needed.  - Call for new or changing lesions.  Skin cancer screening performed today.  Purpura - Violaceous macules and patches - Benign - Related to age, sun damage and/or use of blood thinners - Observe - Can use OTC arnica containing moisturizer such as Dermend Bruise Formula if desired - Call for worsening or other concerns  Return in about 1 year (around 03/20/2021).  IMarye Round, CMA, am acting as scribe for Sarina Ser, MD .  Documentation: I have reviewed the above documentation for accuracy and completeness, and I agree with the above.  Sarina Ser, MD

## 2020-03-20 NOTE — Patient Instructions (Signed)

## 2020-03-21 ENCOUNTER — Encounter: Payer: Self-pay | Admitting: Dermatology

## 2020-03-21 LAB — NOVEL CORONAVIRUS, NAA: SARS-CoV-2, NAA: NOT DETECTED

## 2020-03-21 LAB — SARS-COV-2, NAA 2 DAY TAT

## 2020-03-27 ENCOUNTER — Telehealth: Payer: Self-pay

## 2020-03-27 NOTE — Telephone Encounter (Signed)
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

## 2020-03-27 NOTE — Telephone Encounter (Signed)
Advised patient's husband of results and she will call and schedule appointment for LN2 treatment/hd

## 2020-03-31 NOTE — Addendum Note (Signed)
Addended by: Marye Round on: 03/31/2020 05:44 PM   Modules accepted: Orders

## 2020-03-31 NOTE — Addendum Note (Signed)
Addended by: Marye Round on: 03/31/2020 02:54 PM   Modules accepted: Orders

## 2020-04-16 NOTE — Progress Notes (Signed)
[facility]  [address] [place], [place] [address] Phone: [phone]  Fax: [phone]   Clinic Day:  04/21/2020  Referring physician: [place] Pink, MD  Chief Complaint: Emily Wallace is a 69 y.o. 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  HPI: The patient was last seen in the hematology clinic on 01/15/2020. At that time, she denied any fevers, sweats or weight loss.  She had been sleepy and dizzy when standing.  Exam revealed no adenopathy or hepatosplenomegaly. Hematocrit was 38.9, hemoglobin 13.4, platelets 338,000, WBC 7,500. CMP was normal. Folate was 12.6 and vitamin B12 was 653. She continued hydroxyurea 500 mg BID.  The patient went to the [facility] ER on 03/17/2020 for chest pain the night before. The pain has resolved by the time she got to the ER, but she still reported some chest tightness. D dimers were slightly elevated at 543.19. CTA was negative for acute abnormality. She was to follow up with her PCP after discharge.  During the interim, she has been fine. Her dizziness has resolved and she thinks it was just due to sinus problems. She always has problems with allergies and her heartburn is stable. She denies nausea, vomiting, and diarrhea.   She had a root canal last week and is on amoxicillin.   She takes hydroxyurea 500 mg BID.   Past Medical History:  Diagnosis Date  . Anxiety   . GERD (gastroesophageal reflux disease)   . Hypertension   . Joint pain   . Vitamin D deficiency     Past Surgical History:  Procedure Laterality Date  . GALLBLADDER SURGERY      Family History  Problem Relation Age of Onset  . Colon cancer Brother   . Colon cancer Maternal Aunt   . Breast cancer Maternal Aunt   . Colon cancer Paternal Aunt   . Diabetes Maternal Grandmother   . Colon cancer Paternal Grandmother     Social History:  reports that she has never smoked. She has never used  smokeless tobacco. She reports current alcohol use. She reports that she does not use drugs. Patient smoked "3 cigarettes" the whole time while she was in college. She has rarely drinks alcohol. Patient is a retired Visual merchandiser. Patient denies known exposures to radiation on toxins. She lives in [place]. She has been married for 43 years.She has does water aerobics, and lifts weights. She cooks more. She enjoys 1 hour and 30 minute massages. The patient is alone today.  Allergies:  Allergies  Allergen Reactions  . Codeine     hallucinations  . Codone [Hydrocodone] Rash    Current Medications: Current Outpatient Medications  Medication Sig Dispense Refill  . amoxicillin (AMOXIL) 500 MG tablet     . Ascorbic Acid (VITAMIN C) 100 MG tablet Take 100 mg by mouth daily.    Marland Kitchen aspirin EC 81 MG tablet Take 81 mg by mouth daily.    Marland Kitchen b complex vitamins capsule Take 1 capsule by mouth daily.    . Calcium-Magnesium-Zinc (CAL-MAG-ZINC PO) Take 1 tablet by mouth daily.     . ergocalciferol (VITAMIN D2) 1.25 MG (50000 UT) capsule Take 50,000 Units by mouth once a week.    . esomeprazole (NEXIUM) 40 MG capsule Take 40 mg by mouth daily at 12 noon.     . famotidine (PEPCID) 40 MG tablet Take 40 mg by mouth daily.     . hydroxyurea (HYDREA)  500 MG capsule TAKE 1 CAPSULE(500 MG) BY MOUTH TWICE DAILY. MAY TAKE WITH FOOD TO MINIMIZE GI SIDE EFFECTS 60 capsule 1  . losartan (COZAAR) 50 MG tablet Take 50 mg by mouth daily.     . sertraline (ZOLOFT) 100 MG tablet Take 100 mg by mouth daily.     Marland Kitchen senna (SENOKOT) 8.6 MG tablet Take 1 tablet by mouth daily.     No current facility-administered medications for this visit.   Review of Systems  Constitutional: Positive for weight loss (4 lbs). Negative for chills, diaphoresis, fever and malaise/fatigue.  HENT: Negative.  Negative for congestion, ear discharge, ear pain, hearing loss, nosebleeds, sinus pain, sore throat and tinnitus.   Eyes:  Negative for blurred vision and double vision.  Respiratory: Negative for cough, hemoptysis, sputum production and shortness of breath.   Cardiovascular: Negative.  Negative for chest pain, palpitations, orthopnea, leg swelling and PND.  Gastrointestinal: Positive for heartburn (on PPI therapy, stable). Negative for abdominal pain, blood in stool, constipation, diarrhea, melena, nausea and vomiting.  Genitourinary: Negative.  Negative for dysuria, frequency, hematuria and urgency.  Musculoskeletal: Negative for back pain, falls, joint pain (arthritis), myalgias and neck pain.  Skin: Negative.  Negative for itching and rash.  Neurological: Negative for dizziness, tingling, sensory change, weakness and headaches.  Endo/Heme/Allergies: Positive for environmental allergies. Does not bruise/bleed easily.  Psychiatric/Behavioral: Negative.  Negative for depression and memory loss. The patient is not nervous/anxious and does not have insomnia.   All other systems reviewed and are negative.   Performance status (ECOG): 1  Vitals: Blood pressure (!) 120/91, pulse 83, temperature 98.3 F (36.8 C), temperature source Oral, resp. rate 16, weight 198 lb 6.6 oz (90 kg).  Physical Exam Vitals and nursing note reviewed.  Constitutional:      General: She is not in acute distress.    Appearance: She is well-developed. She is not diaphoretic.  HENT:     Head: Normocephalic and atraumatic.     Comments: Gray hair.    Mouth/Throat:     Mouth: Mucous membranes are moist.     Pharynx: Oropharynx is clear. No oropharyngeal exudate.  Eyes:     General: No scleral icterus.    Extraocular Movements: Extraocular movements intact.     Conjunctiva/sclera: Conjunctivae normal.     Pupils: Pupils are equal, round, and reactive to light.     Comments: Blue eyes.  Cardiovascular:     Rate and Rhythm: Normal rate and regular rhythm.     Heart sounds: Normal heart sounds. No murmur heard.   Pulmonary:      Effort: Pulmonary effort is normal. No respiratory distress.     Breath sounds: Normal breath sounds. No wheezing or rales.  Chest:     Chest wall: No tenderness.  Abdominal:     General: Bowel sounds are normal. There is no distension.     Palpations: Abdomen is soft. There is no hepatomegaly, splenomegaly or mass.     Tenderness: There is no abdominal tenderness. There is no guarding or rebound.  Musculoskeletal:        General: No tenderness. Normal range of motion.     Cervical back: Normal range of motion and neck supple.  Lymphadenopathy:     Head:     Right side of head: No preauricular, posterior auricular or occipital adenopathy.     Left side of head: No preauricular, posterior auricular or occipital adenopathy.     Cervical: No cervical adenopathy.  Upper Body:     Right upper body: No supraclavicular or axillary adenopathy.     Left upper body: No supraclavicular or axillary adenopathy.     Lower Body: No right inguinal adenopathy. No left inguinal adenopathy.  Skin:    General: Skin is warm and dry.  Neurological:     Mental Status: She is alert and oriented to person, place, and time.  Psychiatric:        Behavior: Behavior normal.        Thought Content: Thought content normal.        Judgment: Judgment normal.    Appointment on 04/21/2020  Component Date Value Ref Range Status  . Sodium 04/21/2020 136  135 - 145 mmol/L Final  . Potassium 04/21/2020 4.3  3.5 - 5.1 mmol/L Final  . Chloride 04/21/2020 99  98 - 111 mmol/L Final  . CO2 04/21/2020 28  22 - 32 mmol/L Final  . Glucose, Bld 04/21/2020 97  70 - 99 mg/dL Final   Glucose reference range applies only to samples taken after fasting for at least 8 hours.  . BUN 04/21/2020 22  8 - 23 mg/dL Final  . Creatinine, Ser 04/21/2020 0.94  0.44 - 1.00 mg/dL Final  . Calcium 04/21/2020 9.6  8.9 - 10.3 mg/dL Final  . Total Protein 04/21/2020 8.0  6.5 - 8.1 g/dL Final  . Albumin 04/21/2020 4.3  3.5 - 5.0 g/dL Final   . AST 04/21/2020 17  15 - 41 U/L Final  . ALT 04/21/2020 18  0 - 44 U/L Final  . Alkaline Phosphatase 04/21/2020 70  38 - 126 U/L Final  . Total Bilirubin 04/21/2020 0.9  0.3 - 1.2 mg/dL Final  . GFR, Estimated 04/21/2020 >60  >60 mL/min Final   Comment: (NOTE) Calculated using the CKD-EPI Creatinine Equation (2021)   . Anion gap 04/21/2020 9  5 - 15 Final   Performed at [facility], [address], [place], [place] [address]  . WBC 04/21/2020 7.5  4.0 - 10.5 K/uL Final  . RBC 04/21/2020 3.81* 3.87 - 5.11 MIL/uL Final  . Hemoglobin 04/21/2020 14.1  12.0 - 15.0 g/dL Final  . HCT 04/21/2020 40.5  36 - 46 % Final  . MCV 04/21/2020 106.3* 80.0 - 100.0 fL Final  . MCH 04/21/2020 37.0* 26.0 - 34.0 pg Final  . MCHC 04/21/2020 34.8  30.0 - 36.0 g/dL Final  . RDW 04/21/2020 12.5  11.5 - 15.5 % Final  . Platelets 04/21/2020 302  150 - 400 K/uL Final  . nRBC 04/21/2020 0.0  0.0 - 0.2 % Final  . Neutrophils Relative % 04/21/2020 64  % Final  . Neutro Abs 04/21/2020 4.8  1.7 - 7.7 K/uL Final  . Lymphocytes Relative 04/21/2020 23  % Final  . Lymphs Abs 04/21/2020 1.7  0.7 - 4.0 K/uL Final  . Monocytes Relative 04/21/2020 10  % Final  . Monocytes Absolute 04/21/2020 0.7  0.1 - 1.0 K/uL Final  . Eosinophils Relative 04/21/2020 1  % Final  . Eosinophils Absolute 04/21/2020 0.1  0.0 - 0.5 K/uL Final  . Basophils Relative 04/21/2020 1  % Final  . Basophils Absolute 04/21/2020 0.0  0.0 - 0.1 K/uL Final  . Immature Granulocytes 04/21/2020 1  % Final  . Abs Immature Granulocytes 04/21/2020 0.04  0.00 - 0.07 K/uL Final   Performed at [facility], [address] [place], [place] [address]    Assessment:  GITEL BESTE is  a 69 y.o. female withessential thrombocytosis (ET). Platelet counthas ranged between 65,000 - 590,000 for the past 4 years.  CBC on 12/13/2018revealed a hematocrit of 38.5, hemoglobin 13, MCV 86.7, platelets 590,000, white count 10,800 and ANC of  7600. Differential was unremarkable.  Work-up on 11/04/2017 revealed a hematocrit of 41.5, hemoglobin 14.2, MCV 87, platelets 573,000, WBC 9800 with an ANC of 6500. Differential was normal. Ferritin was 31. Iron studies included a saturation of 25% and TIBC of 293. Sed rate was 11. BCR-ABL was negative. JAK2 V617Fwas positive. von Willebrand panel was normal on 12/13/2017.  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 Reticulin and trichrome stains did not show significant increased in reticulin fibers or collagen deposition. Cytogenetics were normal (57, XX).   She began hydroxyurea500 mg/dayon 12/13/2017. Dose wasincreased to 500 mg BID on MWF and 500 mg daily on T,Th,S,S on 01/09/2018.Dose was increased to 500 mg BID on 02/06/2018.She is on a baby aspirin.  EGDon 07/25/2017 for epigastric pain and dysphasia revealed reflux esophagitis, a single gastric polyp (fundic gland polyp) normal duodenum. H. pylori and Barrett's were negative. Colonoscopyon 09/22/2017 revealed one small polyp in the mid transverse colon (tubular adenoma) and diverticulosis in the sigmoid colon.  She has afamily history of colon cancer(twin brother and paternal aunt).  She has received the second COVID-19 vaccine on 07/09/2019. She received the booster shot.  Symptomatically, she has been fine. Her dizziness has resolved.  Exam is unremarkable.  Plan: 1.   Labs today: CBC with diff, CMP. 2.JAK2 (+) MPN (Essential thrombocythemia) Symptomatically, she is doing well.  Exam reveals no hepatosplenomegaly or adenopathy.   Platelets302,000.Platelet goal < 400,000. Continue hydroxyurea 500 mg p.o. twice daily.  Refill hydroxyurea (dispense #60 with 1 refill). Continue baby aspirin once a day. 3.Macrocytosis  MCV is 106.3  Etiology secondary to  hydroxyurea.  B12 was 653 and folate 12.6 on 01/15/2020.  Check B12 and folate annually. 4.   RTC in 3 months for MD assess and labs (CBC with diff, CMP).  I discussed the assessment and treatment plan with the patient.  The patient was provided an opportunity to ask questions and all were answered.  The patient agreed with the plan and demonstrated an understanding of the instructions.  The patient was advised to call back or seek an in person evaluation if the symptoms worsen or if the condition fails to improve as anticipated.   Nolon Stalls, MD, PhD  04/21/2020, 12:13 PM  I, Mirian Mo Tufford, am acting as Education administrator for [employer]. Mike Gip, MD, PhD.  I, Margarine Grosshans C. Mike Gip, MD, have reviewed the above documentation for accuracy and completeness, and I agree with the above.

## 2020-04-21 ENCOUNTER — Telehealth: Payer: Self-pay | Admitting: *Deleted

## 2020-04-21 ENCOUNTER — Encounter: Payer: Self-pay | Admitting: Hematology and Oncology

## 2020-04-21 ENCOUNTER — Inpatient Hospital Stay (HOSPITAL_BASED_OUTPATIENT_CLINIC_OR_DEPARTMENT_OTHER): Payer: Medicare PPO | Admitting: Hematology and Oncology

## 2020-04-21 ENCOUNTER — Other Ambulatory Visit: Payer: Self-pay

## 2020-04-21 ENCOUNTER — Inpatient Hospital Stay: Payer: Medicare PPO | Attending: Hematology and Oncology

## 2020-04-21 VITALS — BP 120/91 | HR 83 | Temp 98.3°F | Resp 16 | Wt 198.4 lb

## 2020-04-21 DIAGNOSIS — G43109 Migraine with aura, not intractable, without status migrainosus: Secondary | ICD-10-CM | POA: Insufficient documentation

## 2020-04-21 DIAGNOSIS — R079 Chest pain, unspecified: Secondary | ICD-10-CM | POA: Insufficient documentation

## 2020-04-21 DIAGNOSIS — D7589 Other specified diseases of blood and blood-forming organs: Secondary | ICD-10-CM | POA: Diagnosis not present

## 2020-04-21 DIAGNOSIS — D473 Essential (hemorrhagic) thrombocythemia: Secondary | ICD-10-CM

## 2020-04-21 DIAGNOSIS — F419 Anxiety disorder, unspecified: Secondary | ICD-10-CM | POA: Insufficient documentation

## 2020-04-21 DIAGNOSIS — N951 Menopausal and female climacteric states: Secondary | ICD-10-CM | POA: Insufficient documentation

## 2020-04-21 DIAGNOSIS — K219 Gastro-esophageal reflux disease without esophagitis: Secondary | ICD-10-CM | POA: Insufficient documentation

## 2020-04-21 LAB — COMPREHENSIVE METABOLIC PANEL
ALT: 18 U/L (ref 0–44)
AST: 17 U/L (ref 15–41)
Albumin: 4.3 g/dL (ref 3.5–5.0)
Alkaline Phosphatase: 70 U/L (ref 38–126)
Anion gap: 9 (ref 5–15)
BUN: 22 mg/dL (ref 8–23)
CO2: 28 mmol/L (ref 22–32)
Calcium: 9.6 mg/dL (ref 8.9–10.3)
Chloride: 99 mmol/L (ref 98–111)
Creatinine, Ser: 0.94 mg/dL (ref 0.44–1.00)
GFR, Estimated: >60 mL/min (ref >60)
Glucose, Bld: 97 mg/dL (ref 70–99)
Potassium: 4.3 mmol/L (ref 3.5–5.1)
Sodium: 136 mmol/L (ref 135–145)
Total Bilirubin: 0.9 mg/dL (ref 0.3–1.2)
Total Protein: 8 g/dL (ref 6.5–8.1)

## 2020-04-21 LAB — CBC WITH DIFFERENTIAL/PLATELET
Abs Immature Granulocytes: 0.04 10*3/uL (ref 0.00–0.07)
Basophils Absolute: 0 10*3/uL (ref 0.0–0.1)
Basophils Relative: 1 %
Eosinophils Absolute: 0.1 10*3/uL (ref 0.0–0.5)
Eosinophils Relative: 1 %
HCT: 40.5 % (ref 36.0–46.0)
Hemoglobin: 14.1 g/dL (ref 12.0–15.0)
Immature Granulocytes: 1 %
Lymphocytes Relative: 23 %
Lymphs Abs: 1.7 10*3/uL (ref 0.7–4.0)
MCH: 37 pg — ABNORMAL HIGH (ref 26.0–34.0)
MCHC: 34.8 g/dL (ref 30.0–36.0)
MCV: 106.3 fL — ABNORMAL HIGH (ref 80.0–100.0)
Monocytes Absolute: 0.7 10*3/uL (ref 0.1–1.0)
Monocytes Relative: 10 %
Neutro Abs: 4.8 10*3/uL (ref 1.7–7.7)
Neutrophils Relative %: 64 %
Platelets: 302 10*3/uL (ref 150–400)
RBC: 3.81 MIL/uL — ABNORMAL LOW (ref 3.87–5.11)
RDW: 12.5 % (ref 11.5–15.5)
WBC: 7.5 10*3/uL (ref 4.0–10.5)
nRBC: 0 % (ref 0.0–0.2)

## 2020-04-21 MED ORDER — HYDROXYUREA 500 MG PO CAPS: 500.0000 mg | ORAL_CAPSULE | Freq: Two times a day (BID) | ORAL | 1 refills | Status: DC

## 2020-04-21 MED ORDER — HYDROXYUREA 500 MG PO CAPS: ORAL_CAPSULE | ORAL | 1 refills | Status: DC

## 2020-04-21 NOTE — Progress Notes (Signed)
Note entered in error

## 2020-04-21 NOTE — Telephone Encounter (Signed)
Prescription for Hydrea sent without sig. Please resubmit with directions for taking medicine.

## 2020-04-21 NOTE — Progress Notes (Signed)
Patient here for follow up. She has recently had and ER visit for GERD medication added to list.

## 2020-04-28 ENCOUNTER — Other Ambulatory Visit: Payer: Self-pay

## 2020-05-19 ENCOUNTER — Other Ambulatory Visit: Payer: Self-pay | Admitting: Family Medicine

## 2020-05-27 ENCOUNTER — Encounter: Payer: Self-pay | Admitting: Dermatology

## 2020-05-27 ENCOUNTER — Ambulatory Visit: Payer: Medicare PPO | Admitting: Dermatology

## 2020-05-27 ENCOUNTER — Other Ambulatory Visit: Payer: Self-pay

## 2020-05-27 DIAGNOSIS — L570 Actinic keratosis: Secondary | ICD-10-CM | POA: Diagnosis not present

## 2020-05-27 DIAGNOSIS — L578 Other skin changes due to chronic exposure to nonionizing radiation: Secondary | ICD-10-CM

## 2020-05-27 NOTE — Progress Notes (Signed)
   Follow-Up Visit   Subjective  Emily Wallace is a 70 y.o. female who presents for the following: atypical large cell acanthoma  (Bx proven - patient is here today for tx with LN2).  The following portions of the chart were reviewed this encounter and updated as appropriate:   Tobacco  Allergies  Meds  Problems  Med Hx  Surg Hx  Fam Hx     Review of Systems:  No other skin or systemic complaints except as noted in HPI or Assessment and Plan.  Objective  Well appearing patient in no apparent distress; mood and affect are within normal limits.  A focused examination was performed including the legs. Relevant physical exam findings are noted in the Assessment and Plan.  Objective  R prox ant lat thigh: Erythematous thin papules/macules with gritty scale.   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  Destruction of lesion - R prox ant lat thigh Complexity: simple   Destruction method: cryotherapy   Informed consent: discussed and consent obtained   Timeout:  patient name, date of birth, surgical site, and procedure verified Lesion destroyed using liquid nitrogen: Yes   Region frozen until ice ball extended beyond lesion: Yes   Outcome: patient tolerated procedure well with no complications   Post-procedure details: wound care instructions given    Actinic Damage - chronic, secondary to cumulative UV radiation exposure/sun exposure over time - diffuse scaly erythematous macules with underlying dyspigmentation - Recommend daily broad spectrum sunscreen SPF 30+ to sun-exposed areas, reapply every 2 hours as needed.  - Call for new or changing lesions.  Return in about 3 months (around 08/25/2020) for recheck of AK .  Maylene Roes, CMA, am acting as scribe for Armida Sans, MD .  Documentation: I have reviewed the above documentation for accuracy  and completeness, and I agree with the above.  Armida Sans, MD

## 2020-06-02 ENCOUNTER — Other Ambulatory Visit: Payer: Medicare PPO

## 2020-06-02 DIAGNOSIS — Z20822 Contact with and (suspected) exposure to covid-19: Secondary | ICD-10-CM

## 2020-06-03 LAB — NOVEL CORONAVIRUS, NAA: SARS-CoV-2, NAA: NOT DETECTED

## 2020-06-03 LAB — SARS-COV-2, NAA 2 DAY TAT

## 2020-06-16 ENCOUNTER — Encounter: Payer: Self-pay | Admitting: Hematology and Oncology

## 2020-06-16 ENCOUNTER — Other Ambulatory Visit: Payer: Medicare PPO

## 2020-06-16 DIAGNOSIS — Z20822 Contact with and (suspected) exposure to covid-19: Secondary | ICD-10-CM

## 2020-06-17 ENCOUNTER — Other Ambulatory Visit: Payer: Self-pay | Admitting: Hematology and Oncology

## 2020-06-17 DIAGNOSIS — D473 Essential (hemorrhagic) thrombocythemia: Secondary | ICD-10-CM

## 2020-06-17 LAB — SARS-COV-2, NAA 2 DAY TAT

## 2020-06-17 LAB — NOVEL CORONAVIRUS, NAA: SARS-CoV-2, NAA: NOT DETECTED

## 2020-06-17 MED ORDER — HYDROXYUREA 500 MG PO CAPS: 500.0000 mg | ORAL_CAPSULE | Freq: Two times a day (BID) | ORAL | 2 refills | Status: DC

## 2020-06-30 ENCOUNTER — Other Ambulatory Visit: Payer: Medicare PPO

## 2020-06-30 DIAGNOSIS — Z20822 Contact with and (suspected) exposure to covid-19: Secondary | ICD-10-CM

## 2020-07-01 LAB — SARS-COV-2, NAA 2 DAY TAT

## 2020-07-01 LAB — NOVEL CORONAVIRUS, NAA: SARS-CoV-2, NAA: NOT DETECTED

## 2020-07-21 NOTE — Progress Notes (Signed)
[facility]  [address], [address] [place], [place] [address] Phone: [phone]  Fax: [phone]   Clinic Day:  07/22/2020  Referring physician: Maryland Pink, MD  Chief Complaint: Emily Wallace is a 70 y.o. 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  HPI: The patient was last seen in the hematology clinic on 04/21/2020. At that time, she was fine. Her dizziness had resolved.  Exam was unremarkable. Hematocrit was 40.5, hemoglobin 14.1, platelets 302,000, WBC 7,500. CMP was normal. She continued hydroxyurea 500 mg BID.  Bilateral screening mammogram at [facility] on 05/27/2020 revealed no suspicious masses, malignant calcifications, sites of architectural distortion, or concerning asymmetries in either breast.   During the interim, she has been "good." She has vertigo and allergies. She takes Flonase.   She has been losing weight intentionally. She has been eating well and trying to limit her portion sizes. She has also been exercising. Her heartburn has resolved because she stopped eating after 7 pm.   Past Medical History:  Diagnosis Date  . Anxiety   . GERD (gastroesophageal reflux disease)   . Hypertension   . Joint pain   . Vitamin D deficiency     Past Surgical History:  Procedure Laterality Date  . GALLBLADDER SURGERY      Family History  Problem Relation Age of Onset  . Colon cancer Brother   . Colon cancer Maternal Aunt   . Breast cancer Maternal Aunt   . Colon cancer Paternal Aunt   . Diabetes Maternal Grandmother   . Colon cancer Paternal Grandmother     Social History:  reports that she has never smoked. She has never used smokeless tobacco. She reports current alcohol use. She reports that she does not use drugs. Patient smoked "3 cigarettes" the whole time while she was in college. She rarely drinks alcohol. Patient is a retired Visual merchandiser. Patient denies known  exposures to radiation on toxins. She lives in [place]. She has been married for 43 years.She does water aerobics and lifts weights. She cooks more. She enjoys 1 hour and 30 minute massages. The patient is accompanied by her partner today.  Allergies:  Allergies  Allergen Reactions  . Codeine     hallucinations  . Codone [Hydrocodone] Rash    Current Medications: Current Outpatient Medications  Medication Sig Dispense Refill  . Ascorbic Acid (VITAMIN C) 100 MG tablet Take 1,000 mg by mouth daily.    Marland Kitchen aspirin EC 81 MG tablet Take 81 mg by mouth daily.    Marland Kitchen b complex vitamins capsule Take 1 capsule by mouth daily.    . Calcium-Magnesium-Zinc (CAL-MAG-ZINC PO) Take 1 tablet by mouth daily.     . Cholecalciferol (VITAMIN D3) 125 MCG (5000 UT) CAPS Take by mouth.    . esomeprazole (NEXIUM) 40 MG capsule Take 40 mg by mouth daily at 12 noon.     . famotidine (PEPCID) 40 MG tablet Take 40 mg by mouth daily.     . hydroxyurea (HYDREA) 500 MG capsule Take 1 capsule (500 mg total) by mouth 2 (two) times daily. May take with food to minimize GI side effects. 60 capsule 2  . losartan (COZAAR) 50 MG tablet Take 50 mg by mouth daily.     . sertraline (ZOLOFT) 100 MG tablet Take 100 mg by mouth daily.      No current facility-administered medications for this visit.    Review of Systems  Constitutional:  Positive for weight loss (8 lbs, intentional). Negative for chills, diaphoresis, fever and malaise/fatigue.       Feels "good".  HENT: Positive for congestion. Negative for ear discharge, ear pain, hearing loss, nosebleeds, sinus pain, sore throat and tinnitus.   Eyes: Negative for blurred vision and double vision.  Respiratory: Negative for cough, hemoptysis, sputum production and shortness of breath.   Cardiovascular: Negative.  Negative for chest pain, palpitations, orthopnea, leg swelling and PND.  Gastrointestinal: Negative for abdominal pain, blood in stool, constipation, diarrhea,  heartburn, melena, nausea and vomiting.  Genitourinary: Negative.  Negative for dysuria, frequency, hematuria and urgency.  Musculoskeletal: Negative for back pain, falls, joint pain (arthritis), myalgias and neck pain.  Skin: Negative.  Negative for itching and rash.  Neurological: Negative for dizziness, tingling, sensory change, weakness and headaches.  Endo/Heme/Allergies: Positive for environmental allergies. Does not bruise/bleed easily.  Psychiatric/Behavioral: Negative.  Negative for depression and memory loss. The patient is not nervous/anxious and does not have insomnia.   All other systems reviewed and are negative.   Performance status (ECOG): 1  Vitals: Blood pressure 113/69, pulse 96, temperature (!) 97 F (36.1 C), temperature source Tympanic, weight 190 lb 4.1 oz (86.3 kg).  Physical Exam Vitals and nursing note reviewed.  Constitutional:      General: She is not in acute distress.    Appearance: She is well-developed. She is not diaphoretic.  HENT:     Head: Normocephalic and atraumatic.     Comments: Gray hair.    Mouth/Throat:     Mouth: Mucous membranes are moist.     Pharynx: Oropharynx is clear. No oropharyngeal exudate.  Eyes:     General: No scleral icterus.    Extraocular Movements: Extraocular movements intact.     Conjunctiva/sclera: Conjunctivae normal.     Pupils: Pupils are equal, round, and reactive to light.     Comments: Blue eyes.  Cardiovascular:     Rate and Rhythm: Normal rate and regular rhythm.     Heart sounds: Normal heart sounds. No murmur heard.   Pulmonary:     Effort: Pulmonary effort is normal. No respiratory distress.     Breath sounds: Normal breath sounds. No wheezing or rales.  Chest:     Chest wall: No tenderness.  Breasts:     Right: No axillary adenopathy or supraclavicular adenopathy.     Left: No axillary adenopathy or supraclavicular adenopathy.    Abdominal:     General: Bowel sounds are normal. There is no  distension.     Palpations: Abdomen is soft. There is no hepatomegaly, splenomegaly or mass.     Tenderness: There is no abdominal tenderness. There is no guarding or rebound.  Musculoskeletal:        General: No tenderness. Normal range of motion.     Cervical back: Normal range of motion and neck supple.  Lymphadenopathy:     Head:     Right side of head: No preauricular, posterior auricular or occipital adenopathy.     Left side of head: No preauricular, posterior auricular or occipital adenopathy.     Cervical: No cervical adenopathy.     Upper Body:     Right upper body: No supraclavicular or axillary adenopathy.     Left upper body: No supraclavicular or axillary adenopathy.     Lower Body: No right inguinal adenopathy. No left inguinal adenopathy.  Skin:    General: Skin is warm and dry.  Neurological:  Mental Status: She is alert and oriented to person, place, and time.  Psychiatric:        Behavior: Behavior normal.        Thought Content: Thought content normal.        Judgment: Judgment normal.    Appointment on 07/22/2020  Component Date Value Ref Range Status  . Sodium 07/22/2020 135  135 - 145 mmol/L Final  . Potassium 07/22/2020 3.8  3.5 - 5.1 mmol/L Final  . Chloride 07/22/2020 98  98 - 111 mmol/L Final  . CO2 07/22/2020 26  22 - 32 mmol/L Final  . Glucose, Bld 07/22/2020 94  70 - 99 mg/dL Final   Glucose reference range applies only to samples taken after fasting for at least 8 hours.  . BUN 07/22/2020 21  8 - 23 mg/dL Final  . Creatinine, Ser 07/22/2020 0.85  0.44 - 1.00 mg/dL Final  . Calcium 07/22/2020 9.6  8.9 - 10.3 mg/dL Final  . Total Protein 07/22/2020 7.5  6.5 - 8.1 g/dL Final  . Albumin 07/22/2020 4.2  3.5 - 5.0 g/dL Final  . AST 07/22/2020 17  15 - 41 U/L Final  . ALT 07/22/2020 13  0 - 44 U/L Final  . Alkaline Phosphatase 07/22/2020 63  38 - 126 U/L Final  . Total Bilirubin 07/22/2020 1.1  0.3 - 1.2 mg/dL Final  . GFR, Estimated 07/22/2020 >60   >60 mL/min Final   Comment: (NOTE) Calculated using the CKD-EPI Creatinine Equation (2021)   . Anion gap 07/22/2020 11  5 - 15 Final   Performed at [facility], [address] [place], [place] [address]  . WBC 07/22/2020 6.0  4.0 - 10.5 K/uL Final  . RBC 07/22/2020 3.76* 3.87 - 5.11 MIL/uL Final  . Hemoglobin 07/22/2020 14.0  12.0 - 15.0 g/dL Final  . HCT 07/22/2020 40.0  36.0 - 46.0 % Final  . MCV 07/22/2020 106.4* 80.0 - 100.0 fL Final  . MCH 07/22/2020 37.2* 26.0 - 34.0 pg Final  . MCHC 07/22/2020 35.0  30.0 - 36.0 g/dL Final  . RDW 07/22/2020 12.0  11.5 - 15.5 % Final  . Platelets 07/22/2020 275  150 - 400 K/uL Final  . nRBC 07/22/2020 0.0  0.0 - 0.2 % Final  . Neutrophils Relative % 07/22/2020 57  % Final  . Neutro Abs 07/22/2020 3.4  1.7 - 7.7 K/uL Final  . Lymphocytes Relative 07/22/2020 30  % Final  . Lymphs Abs 07/22/2020 1.8  0.7 - 4.0 K/uL Final  . Monocytes Relative 07/22/2020 11  % Final  . Monocytes Absolute 07/22/2020 0.7  0.1 - 1.0 K/uL Final  . Eosinophils Relative 07/22/2020 2  % Final  . Eosinophils Absolute 07/22/2020 0.1  0.0 - 0.5 K/uL Final  . Basophils Relative 07/22/2020 0  % Final  . Basophils Absolute 07/22/2020 0.0  0.0 - 0.1 K/uL Final  . Immature Granulocytes 07/22/2020 0  % Final  . Abs Immature Granulocytes 07/22/2020 0.02  0.00 - 0.07 K/uL Final   Performed at [facility], [address] [place], [place] [address]    Assessment:  Emily Wallace is a 70 y.o. female withessential thrombocytosis (ET). Platelet counthas ranged between 30,000 - 590,000 for the past 4 years.  CBC on 12/13/2018revealed a hematocrit of 38.5, hemoglobin 13, MCV 86.7, platelets 590,000, white count 10,800 and ANC of 7600. Differential was unremarkable.  Work-up on 11/04/2017 revealed a hematocrit of 41.5, hemoglobin 14.2, MCV 87, platelets  573,000, WBC 9800 with an [place] of 6500. Differential was normal. Ferritin was 31. Iron studies  included a saturation of 25% and TIBC of 293. Sed rate was 11. BCR-ABL was negative. JAK2 V617Fwas positive. von Willebrand panel was normal on 12/13/2017.  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 Reticulin and trichrome stains did not show significant increased in reticulin fibers or collagen deposition. Cytogenetics were nDose wasincreasormal (46, XX).   She began hydroxyureaon 12/13/2017.  She is on hydroxyurea 500 mg BID.She is on a baby aspirin.  EGDon 07/25/2017 for epigastric pain and dysphasia revealed reflux esophagitis, a single gastric polyp (fundic gland polyp) normal duodenum. H. pylori and Barrett's were negative. Colonoscopyon 09/22/2017 revealed one small polyp in the mid transverse colon (tubular adenoma) and diverticulosis in the sigmoid colon.  She has afamily history of colon cancer(twin brother and paternal aunt).  She has received the second COVID-19 vaccine on 07/09/2019. She received the booster shot.  Symptomatically, she feels "good".  She has been losing weight intentionally.  Exam is stable.  Plan: 1.   Labs today: CBC with diff, CMP. 2.JAK2 (+) MPN (Essential thrombocythemia) Symptomatically, she continues to feels well.  Exam reveals no adenopathy or hepatosplenomegaly.  Platelets275,000.Platelet goal < 400,000. Continue hydroxyurea 500 mg twice a day.  She requests hydroxyurea refills for 1 month at a time. Continue baby aspirin once a day. 3.Macrocytosis  MCV is 106.4  Etiology secondary to hydroxyurea.  B12 was 653 and folate 12.6 on 01/15/2020.  Check B12, folate and TSH annually. 4.   RTC in 3 months for MD assessment and labs (CBC with diff, CMP).  I discussed the assessment and treatment plan with the patient.  The patient was provided an opportunity to ask questions and all were answered.   The patient agreed with the plan and demonstrated an understanding of the instructions.  The patient was advised to call back or seek an in person evaluation if the symptoms worsen or if the condition fails to improve as anticipated.   Nolon Stalls, MD, PhD  07/22/2020, 11:02 AM  I, Mirian Mo Tufford, am acting as Education administrator for [employer]. Mike Gip, MD, PhD.  I, Jullie Arps C. Mike Gip, MD, have reviewed the above documentation for accuracy and completeness, and I agree with the above.

## 2020-07-22 ENCOUNTER — Other Ambulatory Visit: Payer: Self-pay

## 2020-07-22 ENCOUNTER — Encounter: Payer: Self-pay | Admitting: Hematology and Oncology

## 2020-07-22 ENCOUNTER — Inpatient Hospital Stay: Payer: Medicare PPO | Attending: Hematology and Oncology

## 2020-07-22 ENCOUNTER — Inpatient Hospital Stay: Payer: Medicare PPO | Admitting: Hematology and Oncology

## 2020-07-22 VITALS — BP 113/69 | HR 96 | Temp 97.0°F | Wt 190.3 lb

## 2020-07-22 DIAGNOSIS — D473 Essential (hemorrhagic) thrombocythemia: Secondary | ICD-10-CM

## 2020-07-22 DIAGNOSIS — D7589 Other specified diseases of blood and blood-forming organs: Secondary | ICD-10-CM | POA: Diagnosis not present

## 2020-07-22 LAB — COMPREHENSIVE METABOLIC PANEL
ALT: 13 U/L (ref 0–44)
AST: 17 U/L (ref 15–41)
Albumin: 4.2 g/dL (ref 3.5–5.0)
Alkaline Phosphatase: 63 U/L (ref 38–126)
Anion gap: 11 (ref 5–15)
BUN: 21 mg/dL (ref 8–23)
CO2: 26 mmol/L (ref 22–32)
Calcium: 9.6 mg/dL (ref 8.9–10.3)
Chloride: 98 mmol/L (ref 98–111)
Creatinine, Ser: 0.85 mg/dL (ref 0.44–1.00)
GFR, Estimated: >60 mL/min (ref >60)
Glucose, Bld: 94 mg/dL (ref 70–99)
Potassium: 3.8 mmol/L (ref 3.5–5.1)
Sodium: 135 mmol/L (ref 135–145)
Total Bilirubin: 1.1 mg/dL (ref 0.3–1.2)
Total Protein: 7.5 g/dL (ref 6.5–8.1)

## 2020-07-22 LAB — CBC WITH DIFFERENTIAL/PLATELET
Abs Immature Granulocytes: 0.02 10*3/uL (ref 0.00–0.07)
Basophils Absolute: 0 10*3/uL (ref 0.0–0.1)
Basophils Relative: 0 %
Eosinophils Absolute: 0.1 10*3/uL (ref 0.0–0.5)
Eosinophils Relative: 2 %
HCT: 40 % (ref 36.0–46.0)
Hemoglobin: 14 g/dL (ref 12.0–15.0)
Immature Granulocytes: 0 %
Lymphocytes Relative: 30 %
Lymphs Abs: 1.8 10*3/uL (ref 0.7–4.0)
MCH: 37.2 pg — ABNORMAL HIGH (ref 26.0–34.0)
MCHC: 35 g/dL (ref 30.0–36.0)
MCV: 106.4 fL — ABNORMAL HIGH (ref 80.0–100.0)
Monocytes Absolute: 0.7 10*3/uL (ref 0.1–1.0)
Monocytes Relative: 11 %
Neutro Abs: 3.4 10*3/uL (ref 1.7–7.7)
Neutrophils Relative %: 57 %
Platelets: 275 10*3/uL (ref 150–400)
RBC: 3.76 MIL/uL — ABNORMAL LOW (ref 3.87–5.11)
RDW: 12 % (ref 11.5–15.5)
WBC: 6 10*3/uL (ref 4.0–10.5)
nRBC: 0 % (ref 0.0–0.2)

## 2020-07-23 ENCOUNTER — Encounter: Payer: Self-pay | Admitting: Hematology and Oncology

## 2020-08-22 ENCOUNTER — Other Ambulatory Visit (HOSPITAL_COMMUNITY): Payer: Self-pay | Admitting: Internal Medicine

## 2020-08-22 ENCOUNTER — Ambulatory Visit: Payer: Medicare PPO | Attending: Internal Medicine

## 2020-08-22 DIAGNOSIS — Z23 Encounter for immunization: Secondary | ICD-10-CM

## 2020-08-22 NOTE — Progress Notes (Signed)
   Covid-19 Vaccination Clinic  Name:  Emily Wallace    MRN: [id number] DOB: 1950-11-22  08/22/2020  Emily Wallace was observed post Covid-19 immunization for 15 minutes without incident. She was provided with Vaccine Information Sheet and instruction to access the V-Safe system.   Emily Wallace was instructed to call 911 with any severe reactions post vaccine: Marland Kitchen Difficulty breathing  . Swelling of face and throat  . A fast heartbeat  . A bad rash all over body  . Dizziness and weakness   Immunizations Administered    Name Date Dose VIS Date Route   PFIZER Comrnaty(Gray TOP) Covid-19 Vaccine 08/22/2020 11:43 AM 0.3 mL 05/01/2020 Intramuscular   Manufacturer: [employer], [employer]   Lot: [id number]   [place]: [phone]

## 2020-08-26 ENCOUNTER — Encounter: Payer: Self-pay | Admitting: Dermatology

## 2020-08-26 ENCOUNTER — Ambulatory Visit: Payer: Medicare PPO | Admitting: Dermatology

## 2020-08-26 ENCOUNTER — Other Ambulatory Visit: Payer: Self-pay

## 2020-08-26 DIAGNOSIS — Z872 Personal history of diseases of the skin and subcutaneous tissue: Secondary | ICD-10-CM

## 2020-08-26 DIAGNOSIS — L609 Nail disorder, unspecified: Secondary | ICD-10-CM

## 2020-08-26 DIAGNOSIS — L578 Other skin changes due to chronic exposure to nonionizing radiation: Secondary | ICD-10-CM | POA: Diagnosis not present

## 2020-08-26 NOTE — Patient Instructions (Signed)

## 2020-08-26 NOTE — Progress Notes (Signed)
   Follow-Up Visit   Subjective  Emily Wallace is a 70 y.o. female who presents for the following: Actinic Keratosis (Of the right prox ant lat thigh - bx proven, previously treated with LN2, pt is here today for a recheck) and linear streaks of the toenails (Pt had a pedicure in March and the nail technician noticed dark streaks in some of her toenails. Pt is concerned and would like them checked).  The following portions of the chart were reviewed this encounter and updated as appropriate:   Tobacco  Allergies  Meds  Problems  Med Hx  Surg Hx  Fam Hx     Review of Systems:  No other skin or systemic complaints except as noted in HPI or Assessment and Plan.  Objective  Well appearing patient in no apparent distress; mood and affect are within normal limits.  A focused examination was performed including the legs and feet. Relevant physical exam findings are noted in the Assessment and Plan.  Objective  B/L 3rd toenail: Light colored linear melanonychia. No Hutchinson sign.  Images      Objective  R prox ant lat thigh: Clear.  Assessment & Plan  Nail problem - Light colored linear melanonychia of 2nd toenail (without Hutchinson's sign) B/L 3rd toenail No Hutchinson sign.  Benign-appearing.  Observation.  Call clinic for new or changing lesions.  Recommend daily use of broad spectrum spf 30+ sunscreen to sun-exposed areas.    If change, recommend re-evaluation and biopsy.  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 Clear. Observe for recurrence. Call clinic for new or changing lesions.  Recommend regular skin exams, daily broad-spectrum spf 30+ sunscreen use, and photoprotection.    Actinic Damage - chronic, secondary to cumulative UV radiation exposure/sun exposure over time - diffuse scaly erythematous macules with underlying dyspigmentation - Recommend  daily broad spectrum sunscreen SPF 30+ to sun-exposed areas, reapply every 2 hours as needed.  - Recommend staying in the shade or wearing long sleeves, sun glasses (UVA+UVB protection) and wide brim hats (4-inch brim around the entire circumference of the hat). - Call for new or changing lesions.  Return for appointment as scheduled for TBSE.  Luther Redo, CMA, am acting as scribe for Sarina Ser, MD .  Documentation: I have reviewed the above documentation for accuracy and completeness, and I agree with the above.  Sarina Ser, MD

## 2020-09-13 ENCOUNTER — Encounter: Payer: Self-pay | Admitting: Hematology and Oncology

## 2020-09-29 ENCOUNTER — Encounter: Payer: Self-pay | Admitting: Hematology and Oncology

## 2020-11-06 ENCOUNTER — Other Ambulatory Visit: Payer: Self-pay

## 2020-11-06 DIAGNOSIS — D473 Essential (hemorrhagic) thrombocythemia: Secondary | ICD-10-CM

## 2020-11-10 ENCOUNTER — Other Ambulatory Visit: Payer: Medicare PPO

## 2020-11-10 ENCOUNTER — Ambulatory Visit: Payer: Medicare PPO | Admitting: Hematology and Oncology

## 2020-11-10 NOTE — Progress Notes (Signed)
[facility]  [address] [place], [place] [address] Phone: [phone]  Fax: [phone]   Clinic Day:  11/11/2020  Referring physician: Maryland Pink, MD  Chief Complaint: Emily Wallace is a 70 y.o. 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   History of Presenting Illness: Emily Wallace is a 70 y.o. 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  Platelet count has ranged between 457,000 - 590,000 for the past 4 years.   CBC on 05/05/2017 revealed a hematocrit of 38.5, hemoglobin 13, MCV 86.7, platelets 590,000, white count 10,800 and ANC of 7600.  Differential was unremarkable.   Work-up on 11/04/2017 revealed a hematocrit of 41.5, hemoglobin 14.2, MCV 87, platelets 573,000, WBC 9800 with an ANC of 6500.  Differential was normal.  Ferritin was 31.  Iron studies included a saturation of 25% and TIBC of 293.  Sed rate was 11.  BCR-ABL was negative.  JAK2 V617F was positive.  von Willebrand panel was normal on 12/13/2017.   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   Reticulin and trichrome stains did not show significant increased in reticulin  fibers or collagen deposition.  Cytogenetics were nDose was increasormal (74, XX).    She began hydroxyurea on 12/13/2017.  She is on hydroxyurea 500 mg BID.  She is on a baby aspirin.   EGD on 07/25/2017 for epigastric pain and dysphasia revealed reflux esophagitis, a single gastric polyp (fundic gland polyp) normal duodenum.  H. pylori and Barrett's were negative.  Colonoscopy on 09/22/2017 revealed one small polyp in the mid transverse colon (tubular adenoma) and diverticulosis in the sigmoid colon.   She has a family history of colon cancer (twin brother and paternal aunt).  She has received the second COVID-19 vaccine on 07/09/2019. She received the  booster shot   Interval History: Patient is 70 year old female who returns to clinic for labs and further evaluation.  She continues Hydrea without significant side effects.  She feels well.  Accompanied today by her husband. Denies any neurologic complaints. Denies recent fevers or illnesses. Denies any easy bleeding or bruising. No melena or hematochezia. No pica or restless leg. Reports good appetite and denies weight loss. Denies chest pain. Denies any nausea, vomiting, constipation, or diarrhea. Denies urinary complaints. Patient offers no further specific complaints today.    Past Medical History:  Diagnosis Date   Anxiety    GERD (gastroesophageal reflux disease)    Hypertension    Joint pain    Vitamin D deficiency     Past Surgical History:  Procedure Laterality Date   GALLBLADDER SURGERY      Family History  Problem Relation Age of Onset   Colon cancer Brother    Colon cancer Maternal Aunt    Breast cancer Maternal Aunt    Colon cancer Paternal Aunt    Diabetes Maternal Grandmother    Colon cancer Paternal Grandmother     Social History:  reports that she has never smoked. She has never used smokeless tobacco. She reports current alcohol use. She reports that she does not use drugs. Patient smoked "3 cigarettes" the whole time while she was in college. She rarely drinks alcohol. Patient is a retired Visual merchandiser. Patient denies known exposures to radiation on toxins. She lives in [place].  She has been married for 40+ years. She does water aerobics and lifts weights.  Allergies:  Allergies  Allergen Reactions   Codeine     hallucinations   Codone [Hydrocodone] Rash    Current Medications: Current Outpatient Medications  Medication Sig Dispense Refill   Ascorbic Acid (VITAMIN C) 100 MG tablet Take 1,000 mg by mouth daily.     aspirin EC 81 MG tablet Take 81 mg by mouth daily.     b complex vitamins capsule Take 1 capsule by mouth daily.      Calcium-Magnesium-Zinc (CAL-MAG-ZINC PO) Take 1 tablet by mouth daily.      Cholecalciferol (VITAMIN D3) 125 MCG (5000 UT) CAPS Take by mouth.     COVID-19 mRNA Vac-TriS, Pfizer, SUSP injection USE AS DIRECTED .3 mL 0   esomeprazole (NEXIUM) 40 MG capsule Take 40 mg by mouth daily at 12 noon.      famotidine (PEPCID) 40 MG tablet Take 40 mg by mouth daily.      losartan (COZAAR) 50 MG tablet Take 50 mg by mouth daily.      sertraline (ZOLOFT) 100 MG tablet Take 100 mg by mouth daily.      hydroxyurea (HYDREA) 500 MG capsule Take 1 capsule (500 mg total) by mouth 2 (two) times daily. May take with food to minimize GI side effects. 60 capsule 11   No current facility-administered medications for this visit.    Review of Systems  Constitutional:  Negative for chills, fever, malaise/fatigue and weight loss.  HENT:  Negative for hearing loss, nosebleeds, sore throat and tinnitus.   Eyes:  Negative for blurred vision and double vision.  Respiratory:  Negative for cough, hemoptysis, shortness of breath and wheezing.   Cardiovascular:  Negative for chest pain, palpitations and leg swelling.  Gastrointestinal:  Negative for abdominal pain, blood in stool, constipation, diarrhea, melena, nausea and vomiting.  Genitourinary:  Negative for dysuria and urgency.  Musculoskeletal:  Negative for back pain, falls, joint pain and myalgias.  Skin:  Negative for itching and rash.  Neurological:  Negative for dizziness, tingling, sensory change, loss of consciousness, weakness and headaches.  Endo/Heme/Allergies:  Negative for environmental allergies. Does not bruise/bleed easily.  Psychiatric/Behavioral:  Negative for depression. The patient is not nervous/anxious and does not have insomnia.    Performance status (ECOG): 0  Vitals: Blood pressure 118/71, pulse 77, temperature 97.7 F (36.5 C), resp. rate 18, weight 188 lb 6.1 oz (85.5 kg), SpO2 98 %.  General: Well-developed, well-nourished, no acute  distress. Eyes: Pink conjunctiva, anicteric sclera. Lungs: Clear to auscultation bilaterally.  No audible wheezing or coughing Heart: Regular rate and rhythm.  Abdomen: Soft, nontender, nondistended.  Musculoskeletal: No edema, cyanosis, or clubbing. Neuro: Alert, answering all questions appropriately. Cranial nerves grossly intact. Skin: No rashes or petechiae noted. Psych: Normal affect.   Appointment on 11/11/2020  Component Date Value Ref Range Status   Sodium 11/11/2020 137  135 - 145 mmol/L Final   Potassium 11/11/2020 3.8  3.5 - 5.1 mmol/L Final   Chloride 11/11/2020 101  98 - 111 mmol/L Final   CO2 11/11/2020 29  22 - 32 mmol/L Final   Glucose, Bld 11/11/2020 99  70 - 99 mg/dL Final   Glucose reference range applies only to samples taken after fasting for at least 8 hours.   BUN 11/11/2020 23  8 - 23 mg/dL Final   Creatinine, Ser 11/11/2020 0.90  0.44 - 1.00 mg/dL Final   Calcium 11/11/2020 9.7  8.9 - 10.3 mg/dL Final   Total Protein 11/11/2020 7.5  6.5 - 8.1  g/dL Final   Albumin 11/11/2020 4.0  3.5 - 5.0 g/dL Final   AST 11/11/2020 20  15 - 41 U/L Final   ALT 11/11/2020 21  0 - 44 U/L Final   Alkaline Phosphatase 11/11/2020 76  38 - 126 U/L Final   Total Bilirubin 11/11/2020 0.7  0.3 - 1.2 mg/dL Final   GFR, Estimated 11/11/2020 >60  >60 mL/min Final   Comment: (NOTE) Calculated using the CKD-EPI Creatinine Equation (2021)    Anion gap 11/11/2020 7  5 - 15 Final   Performed at [facility], [address] [place], [place] [address]   WBC 11/11/2020 5.6  4.0 - 10.5 K/uL Final   RBC 11/11/2020 3.49 (A) 3.87 - 5.11 MIL/uL Final   Hemoglobin 11/11/2020 13.3  12.0 - 15.0 g/dL Final   HCT 11/11/2020 37.8  36.0 - 46.0 % Final   MCV 11/11/2020 108.3 (A) 80.0 - 100.0 fL Final   MCH 11/11/2020 38.1 (A) 26.0 - 34.0 pg Final   MCHC 11/11/2020 35.2  30.0 - 36.0 g/dL Final   RDW 11/11/2020 11.9  11.5 - 15.5 % Final   Platelets 11/11/2020 286  150 - 400 K/uL Final    nRBC 11/11/2020 0.0  0.0 - 0.2 % Final   Neutrophils Relative % 11/11/2020 50  % Final   Neutro Abs 11/11/2020 2.8  1.7 - 7.7 K/uL Final   Lymphocytes Relative 11/11/2020 35  % Final   Lymphs Abs 11/11/2020 1.9  0.7 - 4.0 K/uL Final   Monocytes Relative 11/11/2020 12  % Final   Monocytes Absolute 11/11/2020 0.7  0.1 - 1.0 K/uL Final   Eosinophils Relative 11/11/2020 2  % Final   Eosinophils Absolute 11/11/2020 0.1  0.0 - 0.5 K/uL Final   Basophils Relative 11/11/2020 1  % Final   Basophils Absolute 11/11/2020 0.0  0.0 - 0.1 K/uL Final   Immature Granulocytes 11/11/2020 0  % Final   Abs Immature Granulocytes 11/11/2020 0.02  0.00 - 0.07 K/uL Final   Performed at [facility], [address] [place], [place] [address]    Assessment:    1.   JAK2 (+) MPN (Essential thrombocythemia)-platelet goal < 400,000. Currently on hydrea. Tolerating well without significant side effects. Continue hydroxyurea 500 mg twice daily with aspirin 81 mg daily. No adenopathy or hepatosplenomegaly on exam.  Clinically doing well.  Labs reviewed today and overall stable to improved.   2.   Macrocytosis- MCV 108.3. B12 and folate previously normal. Suspect related to hydrea. If MCV > 110 then consider blood smear, b12, folate, copper, TSH, LFTs, Retic  Plan:  RTC in 4 months for lab (cbc, cmp), and NP/MD  I discussed the assessment and treatment plan with the patient.  The patient was provided an opportunity to ask questions and all were answered.  The patient agreed with the plan and demonstrated an understanding of the instructions.  The patient was advised to call back or seek an in person evaluation if the symptoms worsen or if the condition fails to improve as anticipated.  Thank you for allowing me to participate in the care of this very pleasant patient  Beckey Rutter, [place], AGNP-C [facility] at [facility], [place] [phone] (clinic)

## 2020-11-11 ENCOUNTER — Inpatient Hospital Stay: Payer: Medicare PPO | Attending: Nurse Practitioner

## 2020-11-11 ENCOUNTER — Inpatient Hospital Stay: Payer: Medicare PPO | Admitting: Nurse Practitioner

## 2020-11-11 ENCOUNTER — Other Ambulatory Visit: Payer: Self-pay

## 2020-11-11 ENCOUNTER — Encounter: Payer: Self-pay | Admitting: Nurse Practitioner

## 2020-11-11 VITALS — BP 118/71 | HR 77 | Temp 97.7°F | Resp 18 | Wt 188.4 lb

## 2020-11-11 DIAGNOSIS — D473 Essential (hemorrhagic) thrombocythemia: Secondary | ICD-10-CM

## 2020-11-11 DIAGNOSIS — D7589 Other specified diseases of blood and blood-forming organs: Secondary | ICD-10-CM | POA: Insufficient documentation

## 2020-11-11 LAB — CBC WITH DIFFERENTIAL/PLATELET
Abs Immature Granulocytes: 0.02 10*3/uL (ref 0.00–0.07)
Basophils Absolute: 0 10*3/uL (ref 0.0–0.1)
Basophils Relative: 1 %
Eosinophils Absolute: 0.1 10*3/uL (ref 0.0–0.5)
Eosinophils Relative: 2 %
HCT: 37.8 % (ref 36.0–46.0)
Hemoglobin: 13.3 g/dL (ref 12.0–15.0)
Immature Granulocytes: 0 %
Lymphocytes Relative: 35 %
Lymphs Abs: 1.9 10*3/uL (ref 0.7–4.0)
MCH: 38.1 pg — ABNORMAL HIGH (ref 26.0–34.0)
MCHC: 35.2 g/dL (ref 30.0–36.0)
MCV: 108.3 fL — ABNORMAL HIGH (ref 80.0–100.0)
Monocytes Absolute: 0.7 10*3/uL (ref 0.1–1.0)
Monocytes Relative: 12 %
Neutro Abs: 2.8 10*3/uL (ref 1.7–7.7)
Neutrophils Relative %: 50 %
Platelets: 286 10*3/uL (ref 150–400)
RBC: 3.49 MIL/uL — ABNORMAL LOW (ref 3.87–5.11)
RDW: 11.9 % (ref 11.5–15.5)
WBC: 5.6 10*3/uL (ref 4.0–10.5)
nRBC: 0 % (ref 0.0–0.2)

## 2020-11-11 LAB — COMPREHENSIVE METABOLIC PANEL
ALT: 21 U/L (ref 0–44)
AST: 20 U/L (ref 15–41)
Albumin: 4 g/dL (ref 3.5–5.0)
Alkaline Phosphatase: 76 U/L (ref 38–126)
Anion gap: 7 (ref 5–15)
BUN: 23 mg/dL (ref 8–23)
CO2: 29 mmol/L (ref 22–32)
Calcium: 9.7 mg/dL (ref 8.9–10.3)
Chloride: 101 mmol/L (ref 98–111)
Creatinine, Ser: 0.9 mg/dL (ref 0.44–1.00)
GFR, Estimated: >60 mL/min (ref >60)
Glucose, Bld: 99 mg/dL (ref 70–99)
Potassium: 3.8 mmol/L (ref 3.5–5.1)
Sodium: 137 mmol/L (ref 135–145)
Total Bilirubin: 0.7 mg/dL (ref 0.3–1.2)
Total Protein: 7.5 g/dL (ref 6.5–8.1)

## 2020-11-11 MED ORDER — HYDROXYUREA 500 MG PO CAPS: 500.0000 mg | ORAL_CAPSULE | Freq: Two times a day (BID) | ORAL | 11 refills | Status: DC

## 2020-11-11 NOTE — Progress Notes (Signed)
Husband states insurance pays once a month for a 30 day supply; will like not to have a 90 day supply because insurance will not cover 90 but they cover 30 days.

## 2021-01-07 ENCOUNTER — Other Ambulatory Visit (HOSPITAL_COMMUNITY): Payer: Self-pay

## 2021-01-12 ENCOUNTER — Other Ambulatory Visit (HOSPITAL_COMMUNITY): Payer: Self-pay

## 2021-02-06 ENCOUNTER — Other Ambulatory Visit (HOSPITAL_COMMUNITY): Payer: Self-pay

## 2021-02-09 ENCOUNTER — Other Ambulatory Visit (HOSPITAL_COMMUNITY): Payer: Self-pay

## 2021-02-20 ENCOUNTER — Ambulatory Visit: Payer: Medicare PPO | Attending: Internal Medicine

## 2021-02-20 ENCOUNTER — Other Ambulatory Visit: Payer: Self-pay

## 2021-02-20 DIAGNOSIS — Z23 Encounter for immunization: Secondary | ICD-10-CM

## 2021-02-20 MED ORDER — PFIZER COVID-19 VAC BIVALENT 30 MCG/0.3ML IM SUSP
INTRAMUSCULAR | 0 refills | Status: DC
  Filled 2021-02-20: qty 0.3, 1d supply, fill #0

## 2021-02-20 NOTE — Progress Notes (Signed)
   Covid-19 Vaccination Clinic  Name:  Emily Wallace    MRN: [id number] DOB: 19-Nov-1950  02/20/2021  [name] was observed post Covid-19 immunization for 15 minutes without incident. She was provided with Vaccine Information Sheet and instruction to access the V-Safe system.   [name] was instructed to call 911 with any severe reactions post vaccine: Difficulty breathing  Swelling of face and throat  A fast heartbeat  A bad rash all over body  Dizziness and weakness   Lu Duffel, PharmD, MBA Clinical Acute Care Pharmacist

## 2021-03-10 ENCOUNTER — Inpatient Hospital Stay: Payer: Medicare PPO | Attending: Internal Medicine

## 2021-03-10 ENCOUNTER — Inpatient Hospital Stay (HOSPITAL_BASED_OUTPATIENT_CLINIC_OR_DEPARTMENT_OTHER): Payer: Medicare PPO | Admitting: Internal Medicine

## 2021-03-10 ENCOUNTER — Other Ambulatory Visit: Payer: Self-pay

## 2021-03-10 ENCOUNTER — Other Ambulatory Visit: Payer: Medicare PPO

## 2021-03-10 ENCOUNTER — Ambulatory Visit: Payer: Medicare PPO | Admitting: Internal Medicine

## 2021-03-10 DIAGNOSIS — D473 Essential (hemorrhagic) thrombocythemia: Secondary | ICD-10-CM

## 2021-03-10 LAB — COMPREHENSIVE METABOLIC PANEL
ALT: 15 U/L (ref 0–44)
AST: 19 U/L (ref 15–41)
Albumin: 4.2 g/dL (ref 3.5–5.0)
Alkaline Phosphatase: 75 U/L (ref 38–126)
Anion gap: 9 (ref 5–15)
BUN: 19 mg/dL (ref 8–23)
CO2: 27 mmol/L (ref 22–32)
Calcium: 9.2 mg/dL (ref 8.9–10.3)
Chloride: 101 mmol/L (ref 98–111)
Creatinine, Ser: 0.9 mg/dL (ref 0.44–1.00)
GFR, Estimated: >60 mL/min (ref >60)
Glucose, Bld: 91 mg/dL (ref 70–99)
Potassium: 4.2 mmol/L (ref 3.5–5.1)
Sodium: 137 mmol/L (ref 135–145)
Total Bilirubin: 0.4 mg/dL (ref 0.3–1.2)
Total Protein: 7.8 g/dL (ref 6.5–8.1)

## 2021-03-10 LAB — CBC WITH DIFFERENTIAL/PLATELET
Abs Immature Granulocytes: 0.02 10*3/uL (ref 0.00–0.07)
Basophils Absolute: 0.1 10*3/uL (ref 0.0–0.1)
Basophils Relative: 1 %
Eosinophils Absolute: 0.1 10*3/uL (ref 0.0–0.5)
Eosinophils Relative: 1 %
HCT: 39.9 % (ref 36.0–46.0)
Hemoglobin: 13.8 g/dL (ref 12.0–15.0)
Immature Granulocytes: 0 %
Lymphocytes Relative: 37 %
Lymphs Abs: 2.5 10*3/uL (ref 0.7–4.0)
MCH: 36.5 pg — ABNORMAL HIGH (ref 26.0–34.0)
MCHC: 34.6 g/dL (ref 30.0–36.0)
MCV: 105.6 fL — ABNORMAL HIGH (ref 80.0–100.0)
Monocytes Absolute: 0.8 10*3/uL (ref 0.1–1.0)
Monocytes Relative: 11 %
Neutro Abs: 3.3 10*3/uL (ref 1.7–7.7)
Neutrophils Relative %: 50 %
Platelets: 320 10*3/uL (ref 150–400)
RBC: 3.78 MIL/uL — ABNORMAL LOW (ref 3.87–5.11)
RDW: 12.1 % (ref 11.5–15.5)
WBC: 6.7 10*3/uL (ref 4.0–10.5)
nRBC: 0 % (ref 0.0–0.2)

## 2021-03-10 NOTE — Assessment & Plan Note (Addendum)
#   JAK2 (+) MPN (Essential thrombocythemia) on hydrea 320-   On hydrea 500 mg/day BID+ asprin 81. Requests hydroxyurea refills for 1 month at a time. Continue baby aspirin once a day.  #Macrocytosis-secondary to Hydrea.  # DISPOSITION: # follow up in 6 months- MD; labs- cbc/cmp/LDH-Dr.B

## 2021-03-10 NOTE — Progress Notes (Signed)
[name] NOTE  Patient Care Team: Maryland Pink, MD as PCP - General (Family Medicine) Manya Silvas, MD (Inactive) (Gastroenterology)  CHIEF COMPLAINTS/PURPOSE OF CONSULTATION: ET    Oncology History Overview Note   is a 70 y.o. 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  Platelet count has ranged between 457,000 - 590,000 for the past 4 years.   CBC on 05/05/2017 revealed a hematocrit of 38.5, hemoglobin 13, MCV 86.7, platelets 590,000, white count 10,800 and ANC of 7600.  Differential was unremarkable.   Work-up on 11/04/2017 revealed a hematocrit of 41.5, hemoglobin 14.2, MCV 87, platelets 573,000, WBC 9800 with an ANC of 6500.  Differential was normal.  Ferritin was 31.  Iron studies included a saturation of 25% and TIBC of 293.  Sed rate was 11.  BCR-ABL was negative.  JAK2 V617F was positive.  von Willebrand panel was normal on 12/13/2017.   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   Reticulin and trichrome stains did not show significant increased in reticulin  fibers or collagen deposition.  Cytogenetics were nDose was increasormal (1, XX).    She began hydroxyurea on 12/13/2017.  She is on hydroxyurea 500 mg BID.  She is on a baby aspirin.   EGD on 07/25/2017 for epigastric pain and dysphasia revealed reflux esophagitis, a single gastric polyp (fundic gland polyp) normal duodenum.  H. pylori and Barrett's were negative.  Colonoscopy on 09/22/2017 revealed one small polyp in the mid transverse colon (tubular adenoma) and diverticulosis in the sigmoid colon.    Essential thrombocytosis ([place])  11/04/2017 Initial Diagnosis   Essential thrombocytosis (H[facility])     HISTORY OF PRESENTING ILLNESS:  Emily Wallace 69 y.o.  female history of essential thrombocytosis is here for follow-up.  Patient admits to compliance with Hydrea.  Denies any  nausea vomiting diarrhea.  Denies any skin rash.  Denies any thromboembolic events.   Review of Systems  Constitutional:  Negative for chills, diaphoresis, fever, malaise/fatigue and weight loss.  HENT:  Negative for nosebleeds and sore throat.   Eyes:  Negative for double vision.  Respiratory:  Negative for cough, hemoptysis, sputum production, shortness of breath and wheezing.   Cardiovascular:  Negative for chest pain, palpitations, orthopnea and leg swelling.  Gastrointestinal:  Negative for abdominal pain, blood in stool, constipation, diarrhea, heartburn, melena, nausea and vomiting.  Genitourinary:  Negative for dysuria, frequency and urgency.  Musculoskeletal:  Negative for back pain and joint pain.  Skin: Negative.  Negative for itching and rash.  Neurological:  Negative for dizziness, tingling, focal weakness, weakness and headaches.  Endo/Heme/Allergies:  Does not bruise/bleed easily.  Psychiatric/Behavioral:  Negative for depression. The patient is not nervous/anxious and does not have insomnia.     MEDICAL HISTORY:  Past Medical History:  Diagnosis Date   Anxiety    GERD (gastroesophageal reflux disease)    Hypertension    Joint pain    Vitamin D deficiency     SURGICAL HISTORY: Past Surgical History:  Procedure Laterality Date   GALLBLADDER SURGERY      SOCIAL HISTORY: Social History   Socioeconomic History   Marital status: Married    Spouse name: Not on file   Number of children: Not on file   Years of education: Not on file   Highest education level: Not on file  Occupational History   Not on file  Tobacco Use   Smoking  status: Never   Smokeless tobacco: Never  Vaping Use   Vaping Use: Never used  Substance and Sexual Activity   Alcohol use: Yes   Drug use: Never   Sexual activity: Not on file  Other Topics Concern   Not on file  Social History Narrative   Not on file   Social Determinants of Health   Financial Resource Strain: Not on file   Food Insecurity: Not on file  Transportation Needs: Not on file  Physical Activity: Not on file  Stress: Not on file  Social Connections: Not on file  Intimate Partner Violence: Not on file    FAMILY HISTORY: Family History  Problem Relation Age of Onset   Colon cancer Brother    Colon cancer Maternal Aunt    Breast cancer Maternal Aunt    Colon cancer Paternal Aunt    Diabetes Maternal Grandmother    Colon cancer Paternal Grandmother     ALLERGIES:  is allergic to codeine and codone [hydrocodone].  MEDICATIONS:  Current Outpatient Medications  Medication Sig Dispense Refill   ACAI BERRY PO Take by mouth.     Ascorbic Acid (VITAMIN C) 100 MG tablet Take 1,000 mg by mouth daily.     aspirin EC 81 MG tablet Take 81 mg by mouth daily.     b complex vitamins capsule Take 1 capsule by mouth daily.     Calcium-Magnesium-Zinc (CAL-MAG-ZINC PO) Take 1 tablet by mouth daily.      Cholecalciferol (VITAMIN D3) 125 MCG (5000 UT) CAPS Take by mouth.     esomeprazole (NEXIUM) 40 MG capsule Take 40 mg by mouth daily at 12 noon.      famotidine (PEPCID) 40 MG tablet Take 40 mg by mouth daily.      hydroxyurea (HYDREA) 500 MG capsule Take 1 capsule (500 mg total) by mouth 2 (two) times daily. May take with food to minimize GI side effects. 60 capsule 11   losartan (COZAAR) 50 MG tablet Take 1 tablet by mouth daily.     sertraline (ZOLOFT) 100 MG tablet Take 100 mg by mouth daily.      COVID-19 mRNA bivalent vaccine, Pfizer, (PFIZER COVID-19 VAC BIVALENT) injection Inject into the muscle. (Patient not taking: Reported on 03/10/2021) 0.3 mL 0   COVID-19 mRNA Vac-TriS, Pfizer, SUSP injection USE AS DIRECTED (Patient not taking: Reported on 03/10/2021) .3 mL 0   No current facility-administered medications for this visit.      Marland Kitchen  PHYSICAL EXAMINATION:  Vitals:   03/10/21 1451  BP: 130/79  Pulse: 89  Resp: 18  Temp: 97.9 F (36.6 C)  SpO2: 98%   Filed Weights   03/10/21 1451   Weight: 200 lb (90.7 kg)    Physical Exam Vitals and nursing note reviewed.  HENT:     Head: Normocephalic and atraumatic.     Mouth/Throat:     Pharynx: Oropharynx is clear.  Eyes:     Extraocular Movements: Extraocular movements intact.     Pupils: Pupils are equal, round, and reactive to light.  Cardiovascular:     Rate and Rhythm: Normal rate and regular rhythm.  Pulmonary:     Comments: Decreased breath sounds bilaterally.  Abdominal:     Palpations: Abdomen is soft.  Musculoskeletal:        General: Normal range of motion.     Cervical back: Normal range of motion.  Skin:    General: Skin is warm.  Neurological:     General: No  focal deficit present.     Mental Status: She is alert and oriented to person, place, and time.  Psychiatric:        Behavior: Behavior normal.        Judgment: Judgment normal.     LABORATORY DATA:  I have reviewed the data as listed Lab Results  Component Value Date   WBC 6.7 03/10/2021   HGB 13.8 03/10/2021   HCT 39.9 03/10/2021   MCV 105.6 (H) 03/10/2021   PLT 320 03/10/2021   Recent Labs    07/22/20 0927 11/11/20 0928 03/10/21 1432  NA 135 137 137  K 3.8 3.8 4.2  CL 98 101 101  CO2 26 29 27   GLUCOSE 94 99 91  BUN 21 23 19   CREATININE 0.85 0.90 0.90  CALCIUM 9.6 9.7 9.2  GFRNONAA >60 >60 >60  PROT 7.5 7.5 7.8  ALBUMIN 4.2 4.0 4.2  AST 17 20 19   ALT 13 21 15   ALKPHOS 63 76 75  BILITOT 1.1 0.7 0.4    RADIOGRAPHIC STUDIES: I have personally reviewed the radiological images as listed and agreed with the findings in the report. No results found.  Essential thrombocytosis (HCC) #  JAK2 (+) MPN (Essential thrombocythemia) on hydrea 320-   On hydrea 500 mg/day BID+ asprin 81. Requests hydroxyurea refills for 1 month at a time. Continue baby aspirin once a day.  #Macrocytosis-secondary to Hydrea.  # DISPOSITION: # follow up in 6 months- MD; labs- cbc/cmp/LDH-Dr.B    All questions were answered. The patient knows  to call the clinic with any problems, questions or concerns.       Cammie Sickle, MD 04/14/2021 9:18 AM

## 2021-03-26 ENCOUNTER — Other Ambulatory Visit: Payer: Self-pay

## 2021-03-26 ENCOUNTER — Ambulatory Visit (INDEPENDENT_AMBULATORY_CARE_PROVIDER_SITE_OTHER): Payer: Medicare PPO | Admitting: Dermatology

## 2021-03-26 DIAGNOSIS — L578 Other skin changes due to chronic exposure to nonionizing radiation: Secondary | ICD-10-CM

## 2021-03-26 DIAGNOSIS — L570 Actinic keratosis: Secondary | ICD-10-CM | POA: Diagnosis not present

## 2021-03-26 DIAGNOSIS — D229 Melanocytic nevi, unspecified: Secondary | ICD-10-CM

## 2021-03-26 DIAGNOSIS — L814 Other melanin hyperpigmentation: Secondary | ICD-10-CM

## 2021-03-26 DIAGNOSIS — D2371 Other benign neoplasm of skin of right lower limb, including hip: Secondary | ICD-10-CM

## 2021-03-26 DIAGNOSIS — Z1283 Encounter for screening for malignant neoplasm of skin: Secondary | ICD-10-CM | POA: Diagnosis not present

## 2021-03-26 DIAGNOSIS — D1800 Hemangioma unspecified site: Secondary | ICD-10-CM

## 2021-03-26 DIAGNOSIS — L821 Other seborrheic keratosis: Secondary | ICD-10-CM

## 2021-03-26 NOTE — Patient Instructions (Signed)

## 2021-03-26 NOTE — Progress Notes (Signed)
   Follow-Up Visit   Subjective  Emily Wallace is a 70 y.o. female who presents for the following: Annual Exam (Mole check ). Yearly mole check. The patient presents for Total-Body Skin Exam (TBSE) for skin cancer screening and mole check.   The following portions of the chart were reviewed this encounter and updated as appropriate:   Tobacco  Allergies  Meds  Problems  Med Hx  Surg Hx  Fam Hx     Review of Systems:  No other skin or systemic complaints except as noted in HPI or Assessment and Plan.  Objective  Well appearing patient in no apparent distress; mood and affect are within normal limits.  A full examination was performed including scalp, head, eyes, ears, nose, lips, neck, chest, axillae, abdomen, back, buttocks, bilateral upper extremities, bilateral lower extremities, hands, feet, fingers, toes, fingernails, and toenails. All findings within normal limits unless otherwise noted below.  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 Observe for recurrence. Call clinic for new or changing lesions.  Recommend regular skin exams, daily broad-spectrum spf 30+ sunscreen use, and photoprotection.  ;  Lentigines - Scattered tan macules - Due to sun exposure - Benign-appearing, observe - Recommend daily broad spectrum sunscreen SPF 30+ to sun-exposed areas, reapply every 2 hours as needed. - Call for any changes  Seborrheic Keratoses - Stuck-on, waxy, tan-brown papules and/or plaques  - Benign-appearing - Discussed benign etiology and prognosis. - Observe - Call for any changes  Melanocytic Nevi - Tan-brown and/or pink-flesh-colored symmetric macules and papules - Benign appearing on exam today - Observation - Call clinic for new or changing  moles - Recommend daily use of broad spectrum spf 30+ sunscreen to sun-exposed areas.   Hemangiomas - Red papules - Discussed benign nature - Observe - Call for any changes  Actinic Damage - Chronic condition, secondary to cumulative UV/sun exposure - diffuse scaly erythematous macules with underlying dyspigmentation - Recommend daily broad spectrum sunscreen SPF 30+ to sun-exposed areas, reapply every 2 hours as needed.  - Staying in the shade or wearing long sleeves, sun glasses (UVA+UVB protection) and wide brim hats (4-inch brim around the entire circumference of the hat) are also recommended for sun protection.  - Call for new or changing lesions.  Skin cancer screening performed today.   Return in about 1 year (around 03/26/2022) for TBSE, hx of AKs .  IMarye Round, CMA, am acting as scribe for Sarina Ser, MD .  Documentation: I have reviewed the above documentation for accuracy and completeness, and I agree with the above.  Sarina Ser, MD

## 2021-03-29 ENCOUNTER — Encounter: Payer: Self-pay | Admitting: Dermatology

## 2021-09-08 ENCOUNTER — Inpatient Hospital Stay: Payer: Medicare PPO | Admitting: Internal Medicine

## 2021-09-08 ENCOUNTER — Encounter: Payer: Self-pay | Admitting: Internal Medicine

## 2021-09-08 ENCOUNTER — Inpatient Hospital Stay: Payer: Medicare PPO | Attending: Internal Medicine

## 2021-09-08 DIAGNOSIS — Z833 Family history of diabetes mellitus: Secondary | ICD-10-CM | POA: Insufficient documentation

## 2021-09-08 DIAGNOSIS — Z885 Allergy status to narcotic agent status: Secondary | ICD-10-CM | POA: Insufficient documentation

## 2021-09-08 DIAGNOSIS — D473 Essential (hemorrhagic) thrombocythemia: Secondary | ICD-10-CM

## 2021-09-08 DIAGNOSIS — Z800 Family history of malignant neoplasm of digestive organs: Secondary | ICD-10-CM | POA: Diagnosis not present

## 2021-09-08 DIAGNOSIS — Z803 Family history of malignant neoplasm of breast: Secondary | ICD-10-CM | POA: Insufficient documentation

## 2021-09-08 DIAGNOSIS — Z79899 Other long term (current) drug therapy: Secondary | ICD-10-CM | POA: Insufficient documentation

## 2021-09-08 DIAGNOSIS — D7589 Other specified diseases of blood and blood-forming organs: Secondary | ICD-10-CM | POA: Diagnosis not present

## 2021-09-08 DIAGNOSIS — D75839 Thrombocytosis, unspecified: Secondary | ICD-10-CM | POA: Diagnosis not present

## 2021-09-08 LAB — CBC WITH DIFFERENTIAL/PLATELET
Abs Immature Granulocytes: 0.02 10*3/uL (ref 0.00–0.07)
Basophils Absolute: 0.1 10*3/uL (ref 0.0–0.1)
Basophils Relative: 1 %
Eosinophils Absolute: 0.1 10*3/uL (ref 0.0–0.5)
Eosinophils Relative: 2 %
HCT: 39.9 % (ref 36.0–46.0)
Hemoglobin: 13.6 g/dL (ref 12.0–15.0)
Immature Granulocytes: 0 %
Lymphocytes Relative: 35 %
Lymphs Abs: 2.1 10*3/uL (ref 0.7–4.0)
MCH: 36.8 pg — ABNORMAL HIGH (ref 26.0–34.0)
MCHC: 34.1 g/dL (ref 30.0–36.0)
MCV: 107.8 fL — ABNORMAL HIGH (ref 80.0–100.0)
Monocytes Absolute: 0.7 10*3/uL (ref 0.1–1.0)
Monocytes Relative: 11 %
Neutro Abs: 3.1 10*3/uL (ref 1.7–7.7)
Neutrophils Relative %: 51 %
Platelets: 302 10*3/uL (ref 150–400)
RBC: 3.7 MIL/uL — ABNORMAL LOW (ref 3.87–5.11)
RDW: 12.5 % (ref 11.5–15.5)
WBC: 6.1 10*3/uL (ref 4.0–10.5)
nRBC: 0 % (ref 0.0–0.2)

## 2021-09-08 LAB — COMPREHENSIVE METABOLIC PANEL
ALT: 16 U/L (ref 0–44)
AST: 17 U/L (ref 15–41)
Albumin: 4.1 g/dL (ref 3.5–5.0)
Alkaline Phosphatase: 77 U/L (ref 38–126)
Anion gap: 8 (ref 5–15)
BUN: 18 mg/dL (ref 8–23)
CO2: 27 mmol/L (ref 22–32)
Calcium: 9.6 mg/dL (ref 8.9–10.3)
Chloride: 102 mmol/L (ref 98–111)
Creatinine, Ser: 1.02 mg/dL — ABNORMAL HIGH (ref 0.44–1.00)
GFR, Estimated: 59 mL/min — ABNORMAL LOW (ref >60)
Glucose, Bld: 99 mg/dL (ref 70–99)
Potassium: 3.8 mmol/L (ref 3.5–5.1)
Sodium: 137 mmol/L (ref 135–145)
Total Bilirubin: 0.5 mg/dL (ref 0.3–1.2)
Total Protein: 7.7 g/dL (ref 6.5–8.1)

## 2021-09-08 LAB — LACTATE DEHYDROGENASE: LDH: 152 U/L (ref 98–192)

## 2021-09-08 NOTE — Progress Notes (Signed)
Refill any rx's for monthly and not a 90 day supply due to insurance. ? ? ?C/o tiredness, could this be from her RBC? ? ?

## 2021-09-08 NOTE — Progress Notes (Signed)
[facility] ?CONSULT NOTE ? ?Patient Care Team: ?Maryland Pink, MD as PCP - General (Family Medicine) ?Manya Silvas, MD (Inactive) (Gastroenterology) ? ?CHIEF COMPLAINTS/PURPOSE OF CONSULTATION: ET  ? ? ?Oncology History Overview Note  ? 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  Platelet count has ranged between 457,000 - 590,000 for the past 4 years. ?  ?CBC on 05/05/2017 revealed a hematocrit of 38.5, hemoglobin 13, MCV 86.7, platelets 590,000, white count 10,800 and ANC of 7600.  Differential was unremarkable. ?  ?Work-up on 11/04/2017 revealed a hematocrit of 41.5, hemoglobin 14.2, MCV 87, platelets 573,000, WBC 9800 with an ANC of 6500.  Differential was normal.  Ferritin was 31.  Iron studies included a saturation of 25% and TIBC of 293.  Sed rate was 11.  BCR-ABL was negative.  JAK2 V617F was positive.  von Willebrand panel was normal on 12/13/2017. ?  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   Reticulin and trichrome stains did not show significant increased in reticulin  fibers or collagen deposition.  Cytogenetics were nDose was increasormal (21, XX).  ?  ?She began hydroxyurea on 12/13/2017.  She is on hydroxyurea 500 mg BID.  She is on a baby aspirin. ?  ?EGD on 07/25/2017 for epigastric pain and dysphasia revealed reflux esophagitis, a single gastric polyp (fundic gland polyp) normal duodenum.  H. pylori and Barrett's were negative.  Colonoscopy on 09/22/2017 revealed one small polyp in the mid transverse colon (tubular adenoma) and diverticulosis in the sigmoid colon. ? ?  ?Essential thrombocytosis ([place])  ?11/04/2017 Initial Diagnosis  ? Essential thrombocytosis ([place]) ?  ? ? ?HISTORY OF PRESENTING ILLNESS:  ?Emily Wallace 71 y.o.  female history of essential thrombocytosis is here for follow-up. ? ?Patient continues to be compliant with her Hydrea.   Denies any nausea vomiting or diarrhea.  No new  thromboembolic events. ? ?Review of Systems  ?Constitutional:  Negative for chills, diaphoresis, fever, malaise/fatigue and weight loss.  ?HENT:  Negative for nosebleeds and sore throat.   ?Eyes:  Negative for double vision.  ?Respiratory:  Negative for cough, hemoptysis, sputum production, shortness of breath and wheezing.   ?Cardiovascular:  Negative for chest pain, palpitations, orthopnea and leg swelling.  ?Gastrointestinal:  Negative for abdominal pain, blood in stool, constipation, diarrhea, heartburn, melena, nausea and vomiting.  ?Genitourinary:  Negative for dysuria, frequency and urgency.  ?Musculoskeletal:  Negative for back pain and joint pain.  ?Skin: Negative.  Negative for itching and rash.  ?Neurological:  Negative for dizziness, tingling, focal weakness, weakness and headaches.  ?Endo/Heme/Allergies:  Does not bruise/bleed easily.  ?Psychiatric/Behavioral:  Negative for depression. The patient is not nervous/anxious and does not have insomnia.    ? ?MEDICAL HISTORY:  ?Past Medical History:  ?Diagnosis Date  ? Anxiety   ? GERD (gastroesophageal reflux disease)   ? Hypertension   ? Joint pain   ? Vitamin D deficiency   ? ? ?SURGICAL HISTORY: ?Past Surgical History:  ?Procedure Laterality Date  ? GALLBLADDER SURGERY    ? ? ?SOCIAL HISTORY: ?Social History  ? ?Socioeconomic History  ? Marital status: Married  ?  Spouse name: Not on file  ? Number of children: Not on file  ? Years of education: Not on file  ? Highest education level: Not on file  ?Occupational History  ? Not on file  ?Tobacco Use  ? Smoking status: Never  ?  Smokeless tobacco: Never  ?Vaping Use  ? Vaping Use: Never used  ?Substance and Sexual Activity  ? Alcohol use: Yes  ? Drug use: Never  ? Sexual activity: Not on file  ?Other Topics Concern  ? Not on file  ?Social History Narrative  ? Not on file  ? ?Social Determinants of Health  ? ?Financial Resource Strain: Not on file  ?Food  Insecurity: Not on file  ?Transportation Needs: Not on file  ?Physical Activity: Not on file  ?Stress: Not on file  ?Social Connections: Not on file  ?Intimate Partner Violence: Not on file  ? ? ?FAMILY HISTORY: ?Family History  ?Problem Relation Age of Onset  ? Colon cancer Brother   ? Colon cancer Maternal Aunt   ? Breast cancer Maternal Aunt   ? Colon cancer Paternal Aunt   ? Diabetes Maternal Grandmother   ? Colon cancer Paternal Grandmother   ? ? ?ALLERGIES:  is allergic to codeine and codone [hydrocodone]. ? ?MEDICATIONS:  ?Current Outpatient Medications  ?Medication Sig Dispense Refill  ? ACAI BERRY PO Take by mouth.    ? Ascorbic Acid (VITAMIN C) 100 MG tablet Take 1,000 mg by mouth daily.    ? aspirin EC 81 MG tablet Take 81 mg by mouth daily.    ? b complex vitamins capsule Take 1 capsule by mouth daily.    ? Calcium-Magnesium-Zinc (CAL-MAG-ZINC PO) Take 1 tablet by mouth daily.     ? Cholecalciferol (VITAMIN D3) 125 MCG (5000 UT) CAPS Take by mouth.    ? esomeprazole (NEXIUM) 40 MG capsule Take 40 mg by mouth daily at 12 noon.     ? famotidine (PEPCID) 40 MG tablet Take 40 mg by mouth daily.     ? hydroxyurea (HYDREA) 500 MG capsule Take 1 capsule (500 mg total) by mouth 2 (two) times daily. May take with food to minimize GI side effects. 60 capsule 11  ? losartan (COZAAR) 50 MG tablet Take 1 tablet by mouth daily.    ? sertraline (ZOLOFT) 100 MG tablet Take 100 mg by mouth daily.     ? COVID-19 mRNA bivalent vaccine, Pfizer, (PFIZER COVID-19 VAC BIVALENT) injection Inject into the muscle. (Patient not taking: Reported on 03/10/2021) 0.3 mL 0  ? ?No current facility-administered medications for this visit.  ? ? ?  ?. ? ?PHYSICAL EXAMINATION: ? ?Vitals:  ? 09/08/21 1050  ?BP: (!) 135/95  ?Pulse: 89  ?Temp: 98.2 ?F (36.8 ?C)  ?SpO2: 97%  ? ?Filed Weights  ? 09/08/21 1050  ?Weight: 215 lb (97.5 kg)  ? ? ?Physical Exam ?Vitals and nursing note reviewed.  ?HENT:  ?   Head: Normocephalic and atraumatic.  ?    Mouth/Throat:  ?   Pharynx: Oropharynx is clear.  ?Eyes:  ?   Extraocular Movements: Extraocular movements intact.  ?   Pupils: Pupils are equal, round, and reactive to light.  ?Cardiovascular:  ?   Rate and Rhythm: Normal rate and regular rhythm.  ?Pulmonary:  ?   Comments: Decreased breath sounds bilaterally.  ?Abdominal:  ?   Palpations: Abdomen is soft.  ?Musculoskeletal:     ?   General: Normal range of motion.  ?   Cervical back: Normal range of motion.  ?Skin: ?   General: Skin is warm.  ?Neurological:  ?   General: No focal deficit present.  ?   Mental Status: She is alert and oriented to person, place, and time.  ?Psychiatric:     ?  Behavior: Behavior normal.     ?   Judgment: Judgment normal.  ? ? ? ?LABORATORY DATA:  ?I have reviewed the data as listed ?Lab Results  ?Component Value Date  ? WBC 6.1 09/08/2021  ? HGB 13.6 09/08/2021  ? HCT 39.9 09/08/2021  ? MCV 107.8 (H) 09/08/2021  ? PLT 302 09/08/2021  ? ?Recent Labs  ?  11/11/20 ?0928 03/10/21 ?1432  ?NA 137 137  ?K 3.8 4.2  ?CL 101 101  ?CO2 29 27  ?GLUCOSE 99 91  ?BUN 23 19  ?CREATININE 0.90 0.90  ?CALCIUM 9.7 9.2  ?GFRNONAA >60 >60  ?PROT 7.5 7.8  ?ALBUMIN 4.0 4.2  ?AST 20 19  ?ALT 21 15  ?ALKPHOS 76 75  ?BILITOT 0.7 0.4  ? ? ?RADIOGRAPHIC STUDIES: ?I have personally reviewed the radiological images as listed and agreed with the findings in the report. ?No results found. ? 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 Requests hydroxyurea refills for 1 month at a time. Continue baby aspirin once a day- STABLE.  ? ?#Macrocytosis-secondary to Hydrea- STABLE. ? ?# DISPOSITION: ?# follow up in 6 months- MD; labs- cbc/cmp/LDH-Dr.B ? ? ? ?All questions were answered. The patient knows to call the clinic with any problems, questions or concerns. ? ? ? ?  ? Cammie Sickle, MD ?09/08/2021 11:26 AM ? ? ? ? ? ?

## 2021-09-08 NOTE — Assessment & Plan Note (Addendum)
# ?  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 Requests hydroxyurea refills for 1 month at a time. Continue baby aspirin once a day- STABLE.  ? ?#Macrocytosis-secondary to Hydrea- STABLE. ? ?# DISPOSITION: ?# follow up in 6 months- MD; labs- cbc/cmp/LDH-Dr.B ?

## 2021-09-17 ENCOUNTER — Ambulatory Visit: Payer: Medicare PPO

## 2021-09-17 ENCOUNTER — Other Ambulatory Visit: Payer: Self-pay

## 2021-09-17 ENCOUNTER — Ambulatory Visit: Payer: Medicare PPO | Attending: Internal Medicine

## 2021-09-17 DIAGNOSIS — Z23 Encounter for immunization: Secondary | ICD-10-CM

## 2021-09-18 ENCOUNTER — Other Ambulatory Visit: Payer: Self-pay

## 2021-09-18 MED ORDER — PFIZER COVID-19 VAC BIVALENT 30 MCG/0.3ML IM SUSP
INTRAMUSCULAR | 0 refills | Status: DC
  Filled 2021-09-18: qty 0.3, 1d supply, fill #0

## 2021-11-10 ENCOUNTER — Other Ambulatory Visit: Payer: Self-pay | Admitting: *Deleted

## 2021-11-10 DIAGNOSIS — D473 Essential (hemorrhagic) thrombocythemia: Secondary | ICD-10-CM

## 2021-11-10 MED ORDER — HYDROXYUREA 500 MG PO CAPS: 500.0000 mg | ORAL_CAPSULE | Freq: Two times a day (BID) | ORAL | 11 refills | Status: DC

## 2021-11-10 NOTE — Telephone Encounter (Signed)
Contains abnormal data CBC with Differential Order: [id number] Status: Final result    Visible to patient: Yes (seen)    Next appt: 03/09/2022 at 01:15 PM in Oncology ([facility])    Dx: Essential thrombocytosis ([place])    0 Result Notes           Component Ref Range & Units 2 mo ago (09/08/21) 8 mo ago (03/10/21) 12 mo ago (11/11/20) 1 yr ago (07/22/20) 1 yr ago (04/21/20) 1 yr ago (03/17/20) 1 yr ago (01/15/20)  WBC 4.0 - 10.5 K/uL 6.1  6.7  5.6  6.0  7.5  5.7  7.5   RBC 3.87 - 5.11 MIL/uL 3.70 Low   3.78 Low   3.49 Low   3.76 Low   3.81 Low   3.75 Low   3.74 Low    Hemoglobin 12.0 - 15.0 g/dL 13.6  13.8  13.3  14.0  14.1  13.9  13.4   HCT 36.0 - 46.0 % 39.9  39.9  37.8  40.0  40.5  39.7  38.9   MCV 80.0 - 100.0 fL 107.8 High   105.6 High   108.3 High   106.4 High   106.3 High   105.9 High   104.0 High    MCH 26.0 - 34.0 pg 36.8 High   36.5 High   38.1 High   37.2 High   37.0 High   37.1 High   35.8 High    MCHC 30.0 - 36.0 g/dL 34.1  34.6  35.2  35.0  34.8  35.0  34.4   RDW 11.5 - 15.5 % 12.5  12.1  11.9  12.0  12.5  12.6  12.2   Platelets 150 - 400 K/uL 302  320  286  275  302  299  338   nRBC 0.0 - 0.2 % 0.0  0.0  0.0  0.0  0.0  0.0 CM  0.0   Neutrophils Relative % % 51  50  50  57  64   55   Neutro Abs 1.7 - 7.7 K/uL 3.1  3.3  2.8  3.4  4.8   4.2   Lymphocytes Relative % 35  37  35  '30  23   31   '$ Lymphs Abs 0.7 - 4.0 K/uL 2.1  2.5  1.9  1.8  1.7   2.3   Monocytes Relative % '11  11  12  11  10   11   '$ Monocytes Absolute 0.1 - 1.0 K/uL 0.7  0.8  0.7  0.7  0.7   0.8   Eosinophils Relative % '2  1  2  2  1   2   '$ Eosinophils Absolute 0.0 - 0.5 K/uL 0.1  0.1  0.1  0.1  0.1   0.1   Basophils Relative % '1  1  1  '$ 0  1   1   Basophils Absolute 0.0 - 0.1 K/uL 0.1  0.1  0.0  0.0  0.0   0.0   Immature Granulocytes % 0  0  0  0  1   0   Abs Immature Granulocytes 0.00 - 0.07 K/uL 0.02  0.02 CM  0.02 CM  0.02 CM  0.04 CM   0.01 CM   Comment: Performed at [facility], [address]  [place], [place] [address]  Resulting Agency  [facility] [place] [facility] [place] [facility] [place] [facility] [place] [facility] [place] [facility] [place] [facility]  Specimen Collected: 09/08/21 10:32 Last Resulted: 09/08/21 10:47      Lab Flowsheet    Order Details    View Encounter    Lab and Collection Details    Routing    Result History    View All Conversations on this Encounter      CM=Additional comments      Result Care Coordination   Patient Communication   Add Comments   Seen Back to Top       Other Results from 09/08/2021  Lactate dehydrogenase Order: [id number] Status: Final result    Visible to patient: Yes (seen)    Next appt: 03/09/2022 at 01:15 PM in Oncology ([facility])    Dx: Essential thrombocytosis ([place])    0 Result Notes     Component Ref Range & Units 2 mo ago  LDH 98 - 192 U/L 152   Comment: Performed at [facility], [address] [place], [place] [address]  Resulting Agency  [facility]         Specimen Collected: 09/08/21 10:32 Last Resulted: 09/08/21 11:35      Lab Flowsheet    Order Details    View Encounter    Lab and Collection Details    Routing    Result History    View All Conversations on this Encounter        Result Care Coordination   Patient Communication   Add Comments   Seen Back to Top          Contains abnormal data Comprehensive metabolic panel Order: [id number] Status: Final result    Visible to patient: Yes (seen)    Next appt: 03/09/2022 at 01:15 PM in Oncology ([facility])    Dx: Essential thrombocytosis ([place])    0 Result Notes           Component Ref Range & Units 2 mo ago (09/08/21) 8 mo ago (03/10/21) 12 mo ago (11/11/20) 1 yr ago (07/22/20) 1 yr ago (04/21/20) 1 yr ago (03/17/20) 1 yr ago (03/17/20)  Sodium 135 - 145 mmol/L 137  137  137  135  136   139   Potassium 3.5 - 5.1 mmol/L 3.8  4.2  3.8  3.8  4.3   3.6   Chloride 98 - 111 mmol/L 102  101  101  98  99   104   CO2 22 - 32 mmol/L '27  27  29  26  28   26    '$ Glucose, Bld 70 - 99 mg/dL 99  91 CM  99 CM  94 CM  97 CM   111 High  CM   Comment: Glucose reference range applies only to samples taken after fasting for at least 8 hours.  BUN 8 - 23 mg/dL '18  19  23  21  22   15   '$ Creatinine, Ser 0.44 - 1.00 mg/dL 1.02 High   0.90  0.90  0.85  0.94   0.94   Calcium 8.9 - 10.3 mg/dL 9.6  9.2  9.7  9.6  9.6   10.0   Total Protein 6.5 - 8.1 g/dL 7.7  7.8  7.5  7.5  8.0  7.9    Albumin 3.5 - 5.0 g/dL 4.1  4.2  4.0  4.2  4.3  4.3    AST 15 - 41 U/L '17  19  20  17  17  24    '$ ALT 0 - 44 U/L 16  15  $'21  13  18  27    'A$ Alkaline Phosphatase 38 - 126 U/L 77  75  76  63  70  64    Total Bilirubin 0.3 - 1.2 mg/dL 0.5  0.4  0.7  1.1  0.9  0.8    GFR, Estimated >60 mL/min 59 Low   >60 CM  >60 CM  >60 CM  >60 CM   >60 CM   Comment: (NOTE)  Calculated using the CKD-EPI Creatinine Equation (2021)   Anion gap 5 - '15 8  9 '$ CM  7 CM  11 CM  9 CM   9 CM   Comment: Performed at [facility], [address] [place], [place] [address]  Resulting Agency  [facility] [place] [facility] [place] [facility] [place] [facility] [place] [facility] [place] [facility] [place] [facility]         Specimen Collected: 09/08/21 10:32 Last Resulted: 09/08/21 11:44

## 2021-11-17 IMAGING — US US SOFT TISSUE HEAD/NECK
1 series · 9 of 9 positions shown · non-contrast
Comparison: CT 07/17/2010

CLINICAL DATA: 68-year-old female with a history of neck lump

EXAM:
ULTRASOUND OF HEAD/NECK SOFT TISSUES
TECHNIQUE: Ultrasound examination of the head and neck soft tissues was
performed in the area of clinical concern.

[Series 1: us soft tissue head/neck · 0.07mm/px · 9 acquisitions, 9 frames shown]
[im 1/9]
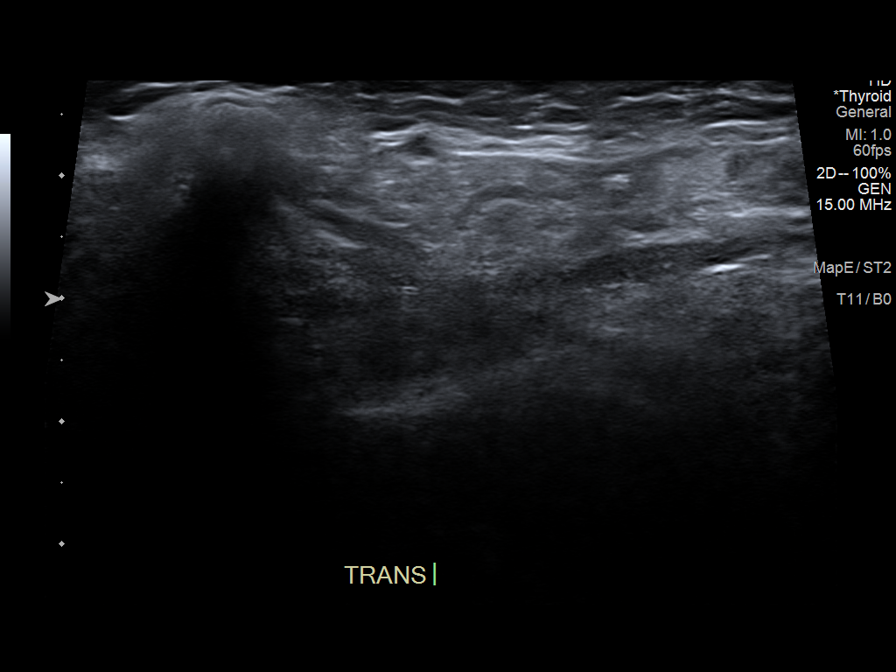
[im 2/9]
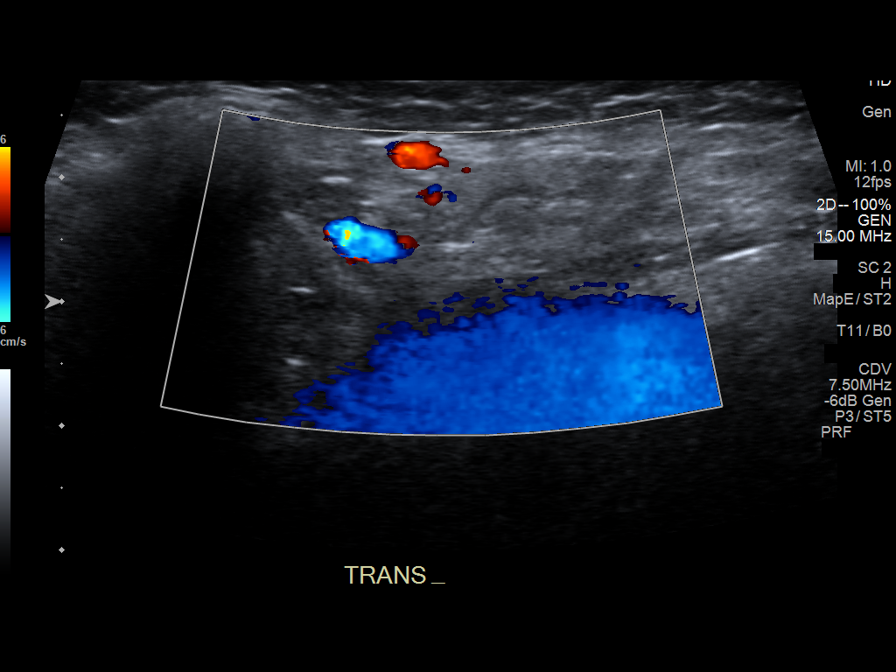
[im 3/9]
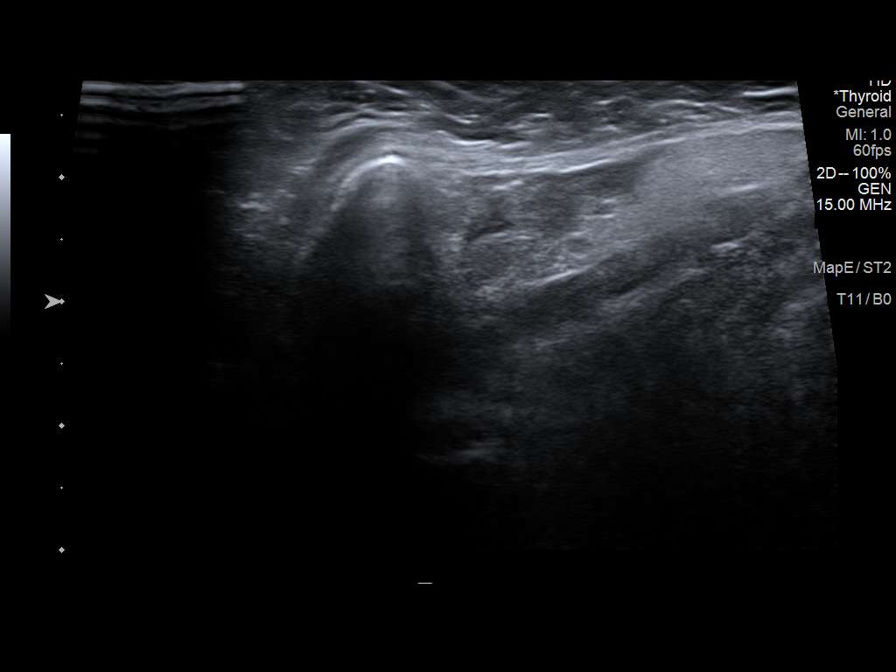
[im 4/9]
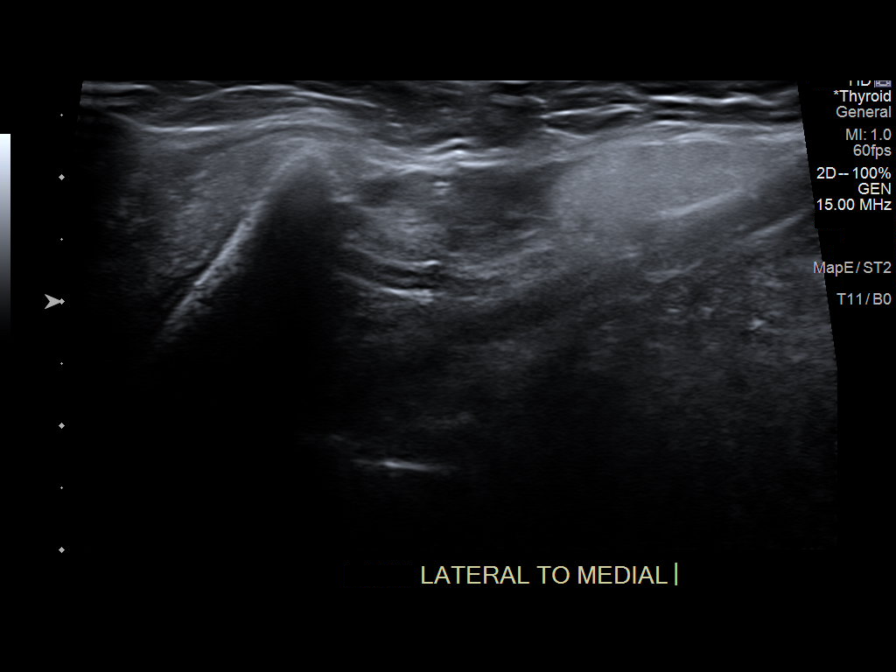
[im 5/9]
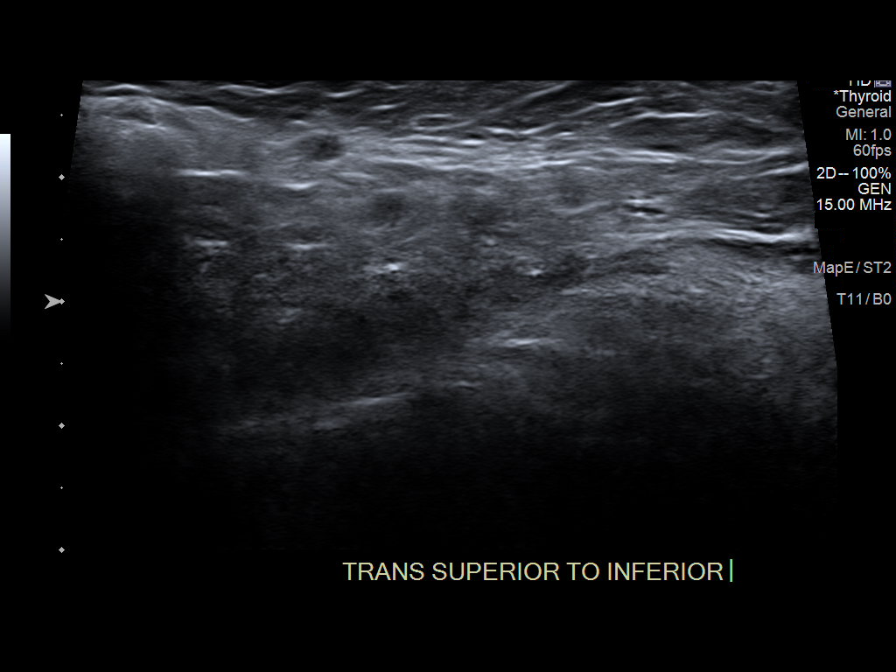
[im 6/9]
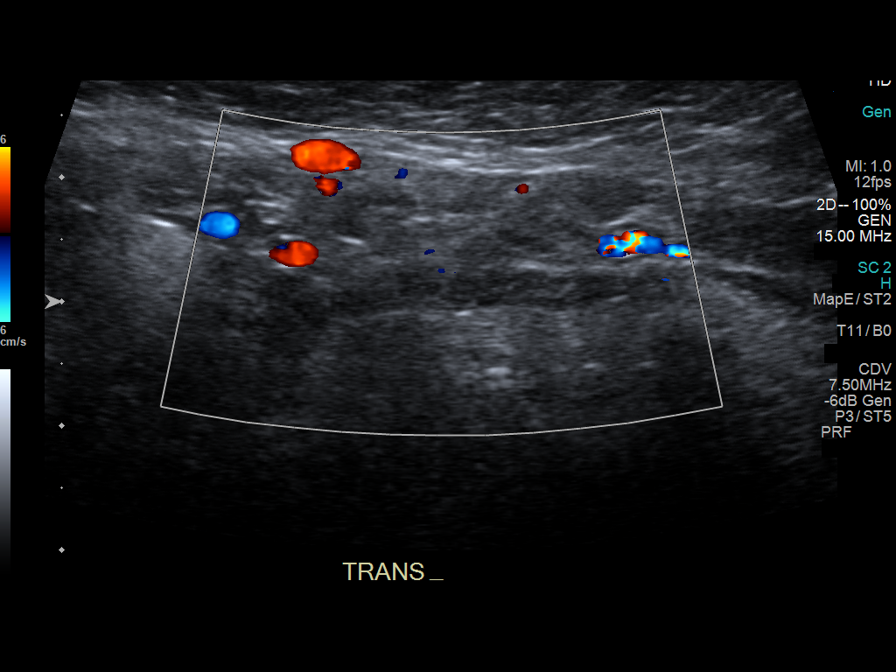
[im 7/9]
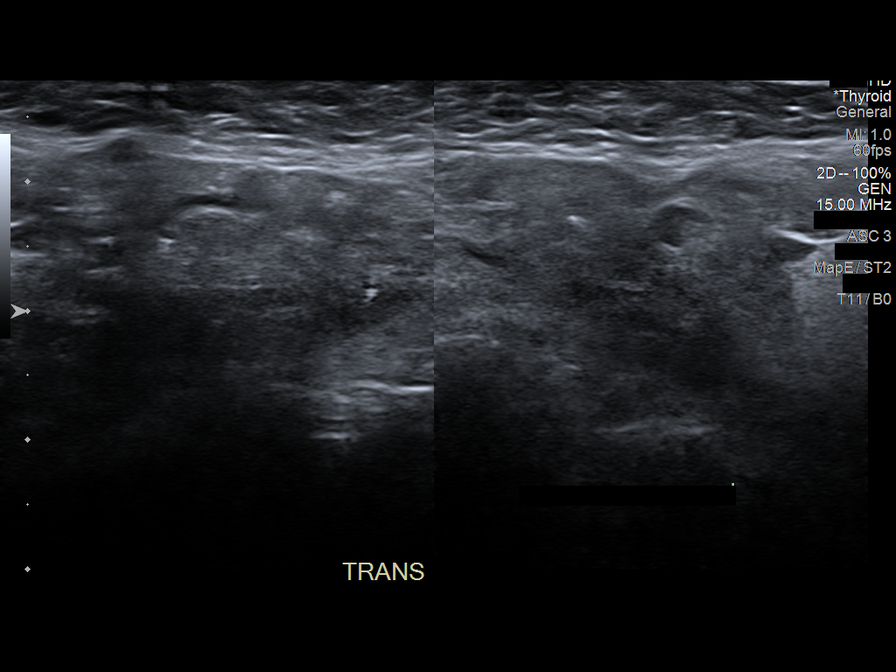
[im 8/9]
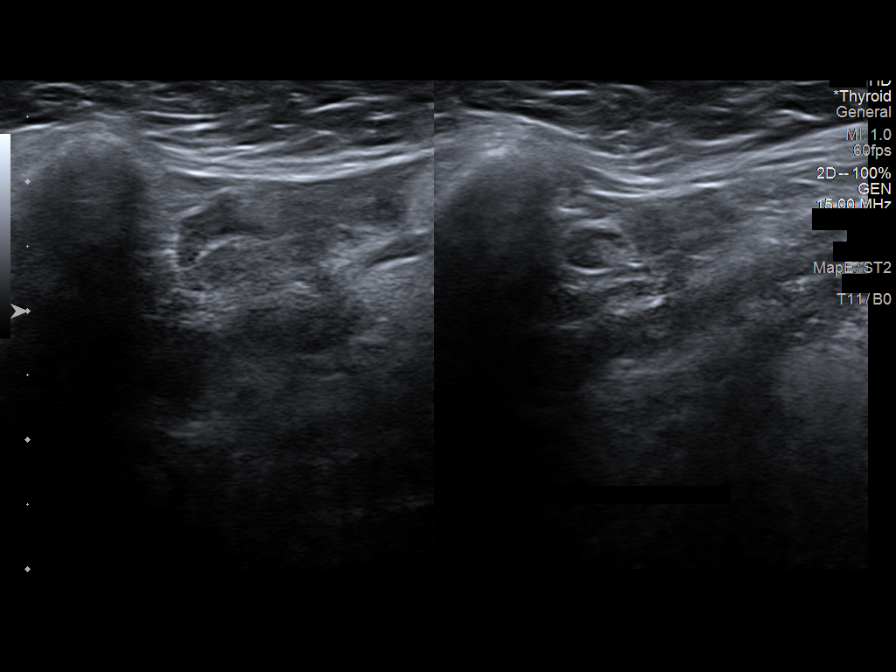
[im 9/9]
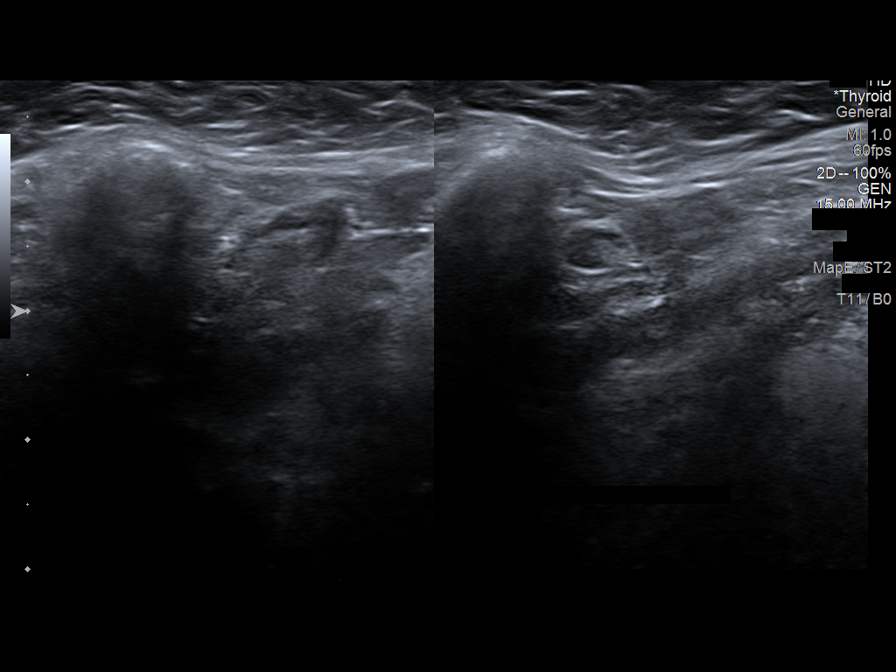

[9 of 9 positions shown; findings below may reference images not displayed]

FINDINGS: Grayscale and color duplex performed in the region of clinical
concern.

No focal fluid. No soft tissue lesion. Typical appearing lymph nodes
in the region clinical concern, not enlarged.
IMPRESSION: Typical appearing lymph nodes in the region of clinical concern,
potentially reactive though nonspecific.

## 2022-03-09 ENCOUNTER — Inpatient Hospital Stay: Payer: Medicare PPO

## 2022-03-09 ENCOUNTER — Inpatient Hospital Stay: Payer: Medicare PPO | Admitting: Internal Medicine

## 2022-03-15 ENCOUNTER — Other Ambulatory Visit: Payer: Medicare PPO

## 2022-03-15 ENCOUNTER — Ambulatory Visit: Payer: Medicare PPO | Admitting: Internal Medicine

## 2022-03-17 ENCOUNTER — Inpatient Hospital Stay: Payer: Medicare PPO | Attending: Internal Medicine

## 2022-03-17 ENCOUNTER — Inpatient Hospital Stay: Payer: Medicare PPO | Admitting: Internal Medicine

## 2022-03-17 ENCOUNTER — Encounter: Payer: Self-pay | Admitting: Internal Medicine

## 2022-03-17 DIAGNOSIS — D7589 Other specified diseases of blood and blood-forming organs: Secondary | ICD-10-CM | POA: Insufficient documentation

## 2022-03-17 DIAGNOSIS — E559 Vitamin D deficiency, unspecified: Secondary | ICD-10-CM | POA: Insufficient documentation

## 2022-03-17 DIAGNOSIS — Z79899 Other long term (current) drug therapy: Secondary | ICD-10-CM | POA: Diagnosis not present

## 2022-03-17 DIAGNOSIS — D473 Essential (hemorrhagic) thrombocythemia: Secondary | ICD-10-CM | POA: Diagnosis present

## 2022-03-17 DIAGNOSIS — K219 Gastro-esophageal reflux disease without esophagitis: Secondary | ICD-10-CM | POA: Insufficient documentation

## 2022-03-17 DIAGNOSIS — Z803 Family history of malignant neoplasm of breast: Secondary | ICD-10-CM | POA: Insufficient documentation

## 2022-03-17 DIAGNOSIS — I10 Essential (primary) hypertension: Secondary | ICD-10-CM | POA: Insufficient documentation

## 2022-03-17 DIAGNOSIS — M2550 Pain in unspecified joint: Secondary | ICD-10-CM | POA: Insufficient documentation

## 2022-03-17 DIAGNOSIS — Z800 Family history of malignant neoplasm of digestive organs: Secondary | ICD-10-CM | POA: Diagnosis not present

## 2022-03-17 DIAGNOSIS — Z7982 Long term (current) use of aspirin: Secondary | ICD-10-CM | POA: Insufficient documentation

## 2022-03-17 LAB — COMPREHENSIVE METABOLIC PANEL
ALT: 19 U/L (ref 0–44)
AST: 43 U/L — ABNORMAL HIGH (ref 15–41)
Albumin: 4 g/dL (ref 3.5–5.0)
Alkaline Phosphatase: 72 U/L (ref 38–126)
Anion gap: 7 (ref 5–15)
BUN: 24 mg/dL — ABNORMAL HIGH (ref 8–23)
CO2: 25 mmol/L (ref 22–32)
Calcium: 9.1 mg/dL (ref 8.9–10.3)
Chloride: 105 mmol/L (ref 98–111)
Creatinine, Ser: 1.04 mg/dL — ABNORMAL HIGH (ref 0.44–1.00)
GFR, Estimated: 57 mL/min — ABNORMAL LOW (ref >60)
Glucose, Bld: 110 mg/dL — ABNORMAL HIGH (ref 70–99)
Potassium: 3.8 mmol/L (ref 3.5–5.1)
Sodium: 137 mmol/L (ref 135–145)
Total Bilirubin: 0.6 mg/dL (ref 0.3–1.2)
Total Protein: 8 g/dL (ref 6.5–8.1)

## 2022-03-17 LAB — CBC WITH DIFFERENTIAL/PLATELET
Abs Immature Granulocytes: 0.03 10*3/uL (ref 0.00–0.07)
Basophils Absolute: 0 10*3/uL (ref 0.0–0.1)
Basophils Relative: 0 %
Eosinophils Absolute: 0 10*3/uL (ref 0.0–0.5)
Eosinophils Relative: 0 %
HCT: 42.1 % (ref 36.0–46.0)
Hemoglobin: 14.2 g/dL (ref 12.0–15.0)
Immature Granulocytes: 0 %
Lymphocytes Relative: 23 %
Lymphs Abs: 2.6 10*3/uL (ref 0.7–4.0)
MCH: 36.8 pg — ABNORMAL HIGH (ref 26.0–34.0)
MCHC: 33.7 g/dL (ref 30.0–36.0)
MCV: 109.1 fL — ABNORMAL HIGH (ref 80.0–100.0)
Monocytes Absolute: 1.1 10*3/uL — ABNORMAL HIGH (ref 0.1–1.0)
Monocytes Relative: 10 %
Neutro Abs: 7.5 10*3/uL (ref 1.7–7.7)
Neutrophils Relative %: 67 %
Platelets: 369 10*3/uL (ref 150–400)
RBC: 3.86 MIL/uL — ABNORMAL LOW (ref 3.87–5.11)
RDW: 11.9 % (ref 11.5–15.5)
WBC: 11.4 10*3/uL — ABNORMAL HIGH (ref 4.0–10.5)
nRBC: 0 % (ref 0.0–0.2)

## 2022-03-17 LAB — LACTATE DEHYDROGENASE: LDH: 221 U/L — ABNORMAL HIGH (ref 98–192)

## 2022-03-17 NOTE — Progress Notes (Signed)
[place] NOTE  Patient Care Team: [place] Pink, MD as PCP - General (Family Medicine) Manya Silvas, MD (Inactive) (Gastroenterology)  CHIEF COMPLAINTS/PURPOSE OF CONSULTATION: ET    Oncology History Overview Note   is a 72 y.o. 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  Platelet count has ranged between 457,000 - 590,000 for the past 4 years.   CBC on 05/05/2017 revealed a hematocrit of 38.5, hemoglobin 13, MCV 86.7, platelets 590,000, white count 10,800 and ANC of 7600.  Differential was unremarkable.   Work-up on 11/04/2017 revealed a hematocrit of 41.5, hemoglobin 14.2, MCV 87, platelets 573,000, WBC 9800 with an ANC of 6500.  Differential was normal.  Ferritin was 31.  Iron studies included a saturation of 25% and TIBC of 293.  Sed rate was 11.  BCR-ABL was negative.  JAK2 V617F was positive.  von Willebrand panel was normal on 12/13/2017.   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   Reticulin and trichrome stains did not show significant increased in reticulin  fibers or collagen deposition.  Cytogenetics were nDose was increasormal (74, XX).    She began hydroxyurea on 12/13/2017.  She is on hydroxyurea 500 mg BID.  She is on a baby aspirin.   EGD on 07/25/2017 for epigastric pain and dysphasia revealed reflux esophagitis, a single gastric polyp (fundic gland polyp) normal duodenum.  H. pylori and Barrett's were negative.  Colonoscopy on 09/22/2017 revealed one small polyp in the mid transverse colon (tubular adenoma) and diverticulosis in the sigmoid colon.    Essential thrombocytosis ([place])  11/04/2017 Initial Diagnosis   Essential thrombocytosis ([facility])     HISTORY OF PRESENTING ILLNESS: Alone. Walking with a stick [new]  Kanai A Brodt 71 y.o.  female history of essential thrombocytosis is here for follow-up.  Pt seen by PCP, Dr  Kary Kos, at [facility] clinic today. Pt has been having irregular heart rate and is wearing a heart monitor at this time. she is currently awaiting evaluation with with cardiology.  Patient continues to be compliant with her Hydrea.  Denies any nausea vomiting or diarrhea.  No new  thromboembolic events.  Review of Systems  Constitutional:  Negative for chills, diaphoresis, fever, malaise/fatigue and weight loss.  HENT:  Negative for nosebleeds and sore throat.   Eyes:  Negative for double vision.  Respiratory:  Negative for cough, hemoptysis, sputum production, shortness of breath and wheezing.   Cardiovascular:  Negative for chest pain, palpitations, orthopnea and leg swelling.  Gastrointestinal:  Negative for abdominal pain, blood in stool, constipation, diarrhea, heartburn, melena, nausea and vomiting.  Genitourinary:  Negative for dysuria, frequency and urgency.  Musculoskeletal:  Negative for back pain and joint pain.  Skin: Negative.  Negative for itching and rash.  Neurological:  Negative for dizziness, tingling, focal weakness, weakness and headaches.  Endo/Heme/Allergies:  Does not bruise/bleed easily.  Psychiatric/Behavioral:  Negative for depression. The patient is not nervous/anxious and does not have insomnia.      MEDICAL HISTORY:  Past Medical History:  Diagnosis Date   Anxiety    GERD (gastroesophageal reflux disease)    Hypertension    Joint pain    Vitamin D deficiency     SURGICAL HISTORY: Past Surgical History:  Procedure Laterality Date   GALLBLADDER SURGERY      SOCIAL HISTORY: Social History   Socioeconomic History   Marital status: Married    Spouse name: Not  on file   Number of children: Not on file   Years of education: Not on file   Highest education level: Not on file  Occupational History   Not on file  Tobacco Use   Smoking status: Never   Smokeless tobacco: Never  Vaping Use   Vaping Use: Never used  Substance and Sexual Activity    Alcohol use: Yes   Drug use: Never   Sexual activity: Not on file  Other Topics Concern   Not on file  Social History Narrative   Not on file   Social Determinants of Health   Financial Resource Strain: Not on file  Food Insecurity: Not on file  Transportation Needs: Not on file  Physical Activity: Not on file  Stress: Not on file  Social Connections: Not on file  Intimate Partner Violence: Not on file    FAMILY HISTORY: Family History  Problem Relation Age of Onset   Colon cancer Brother    Colon cancer Maternal Aunt    Breast cancer Maternal Aunt    Colon cancer Paternal Aunt    Diabetes Maternal Grandmother    Colon cancer Paternal Grandmother     ALLERGIES:  is allergic to codeine and codone [hydrocodone].  MEDICATIONS:  Current Outpatient Medications  Medication Sig Dispense Refill   ACAI BERRY PO Take by mouth.     Ascorbic Acid (VITAMIN C) 100 MG tablet Take 1,000 mg by mouth daily.     aspirin EC 81 MG tablet Take 81 mg by mouth daily.     b complex vitamins capsule Take 1 capsule by mouth daily.     Calcium-Magnesium-Zinc (CAL-MAG-ZINC PO) Take 1 tablet by mouth daily.      Cholecalciferol (VITAMIN D3) 125 MCG (5000 UT) CAPS Take by mouth.     esomeprazole (NEXIUM) 40 MG capsule Take 40 mg by mouth daily at 12 noon.      famotidine (PEPCID) 40 MG tablet Take 40 mg by mouth daily.      hydroxyurea (HYDREA) 500 MG capsule Take 1 capsule (500 mg total) by mouth 2 (two) times daily. May take with food to minimize GI side effects. 60 capsule 11   losartan (COZAAR) 50 MG tablet Take 1 tablet by mouth daily.     sertraline (ZOLOFT) 100 MG tablet Take 100 mg by mouth daily.      No current facility-administered medications for this visit.     PHYSICAL EXAMINATION:  Vitals:   03/17/22 1441  BP: 130/80  Pulse: 62  Resp: 16  Temp: 97.6 F (36.4 C)  SpO2: 98%   Filed Weights   03/17/22 1441  Weight: 210 lb (95.3 kg)    Physical Exam Vitals and nursing  note reviewed.  HENT:     Head: Normocephalic and atraumatic.     Mouth/Throat:     Pharynx: Oropharynx is clear.  Eyes:     Extraocular Movements: Extraocular movements intact.     Pupils: Pupils are equal, round, and reactive to light.  Cardiovascular:     Rate and Rhythm: Normal rate and regular rhythm.  Pulmonary:     Comments: Decreased breath sounds bilaterally.  Abdominal:     Palpations: Abdomen is soft.  Musculoskeletal:        General: Normal range of motion.     Cervical back: Normal range of motion.  Skin:    General: Skin is warm.  Neurological:     General: No focal deficit present.     Mental  Status: She is alert and oriented to person, place, and time.  Psychiatric:        Behavior: Behavior normal.        Judgment: Judgment normal.      LABORATORY DATA:  I have reviewed the data as listed Lab Results  Component Value Date   WBC 11.4 (H) 03/17/2022   HGB 14.2 03/17/2022   HCT 42.1 03/17/2022   MCV 109.1 (H) 03/17/2022   PLT 369 03/17/2022   Recent Labs    09/08/21 1032 03/17/22 1356  NA 137 137  K 3.8 3.8  CL 102 105  CO2 27 25  GLUCOSE 99 110*  BUN 18 24*  CREATININE 1.02* 1.04*  CALCIUM 9.6 9.1  GFRNONAA 59* 57*  PROT 7.7 8.0  ALBUMIN 4.1 4.0  AST 17 43*  ALT 16 19  ALKPHOS 77 72  BILITOT 0.5 0.6    RADIOGRAPHIC STUDIES: I have personally reviewed the radiological images as listed and agreed with the findings in the report. No results found.  Essential thrombocytosis ([facility]) #  JAK2 (+) MPN (Essential thrombocythemia) on hydrea 368-   On hydrea 500 mg/day BID+ asprin 81. Requests hydroxyurea refills for 1 month at a time. Continue baby aspirin once a day- STABLE.   #Macrocytosis-secondary to Hydrea- STABLE.  # Dizzy spells/tachycardia/dyspnea- s/p evaluation- with PCP/cards-   # DISPOSITION: # follow up in 6 months- MD; labs- cbc/cmp/LDH-Dr.B    All questions were answered. The patient knows to call the clinic with any  problems, questions or concerns.       Cammie Sickle, MD 03/17/2022 11:26 PM

## 2022-03-17 NOTE — Progress Notes (Signed)
Pt seen by cardiologist Dr Kary Kos, at [facility] clinic today. Pt has been having irregular heart rate and is wearing a heart monitor at this time.

## 2022-03-17 NOTE — Assessment & Plan Note (Addendum)
#   JAK2 (+) MPN (Essential thrombocythemia) on hydrea 368-   On hydrea 500 mg/day BID+ asprin 81. Requests hydroxyurea refills for 1 month at a time. Continue baby aspirin once a day- STABLE.   #Macrocytosis-secondary to Hydrea- STABLE.  # Dizzy spells/tachycardia/dyspnea-no clinical concern for stroke or TIA.  This is unrelated to her essential thrombocytosis.  S/p evaluation- with PCP/cards-   # DISPOSITION: # follow up in 6 months- MD; labs- cbc/cmp/LDH-Dr.B

## 2022-04-01 ENCOUNTER — Ambulatory Visit: Payer: Medicare PPO | Admitting: Dermatology

## 2022-04-01 DIAGNOSIS — L820 Inflamed seborrheic keratosis: Secondary | ICD-10-CM | POA: Diagnosis not present

## 2022-04-01 DIAGNOSIS — L814 Other melanin hyperpigmentation: Secondary | ICD-10-CM

## 2022-04-01 DIAGNOSIS — L111 Transient acantholytic dermatosis [Grover]: Secondary | ICD-10-CM

## 2022-04-01 DIAGNOSIS — L578 Other skin changes due to chronic exposure to nonionizing radiation: Secondary | ICD-10-CM

## 2022-04-01 DIAGNOSIS — L719 Rosacea, unspecified: Secondary | ICD-10-CM | POA: Diagnosis not present

## 2022-04-01 DIAGNOSIS — L918 Other hypertrophic disorders of the skin: Secondary | ICD-10-CM

## 2022-04-01 DIAGNOSIS — Z1283 Encounter for screening for malignant neoplasm of skin: Secondary | ICD-10-CM | POA: Diagnosis not present

## 2022-04-01 DIAGNOSIS — D229 Melanocytic nevi, unspecified: Secondary | ICD-10-CM

## 2022-04-01 DIAGNOSIS — Z79899 Other long term (current) drug therapy: Secondary | ICD-10-CM

## 2022-04-01 DIAGNOSIS — L821 Other seborrheic keratosis: Secondary | ICD-10-CM

## 2022-04-01 MED ORDER — MOMETASONE FUROATE 0.1 % EX CREA: TOPICAL_CREAM | CUTANEOUS | 0 refills | Status: AC

## 2022-04-01 NOTE — Patient Instructions (Addendum)
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  It more commonly presents in the winter and can be associated with atopic dermatitis, renal failure, transplantation and malignancies. It is very difficult to treat and can be persistent and recurrent but topical steroids, calcipotriene cream, antihistamines can be helpful.  For recalcitrant disease, other treatments have been used such as isotretinoin, acitretin, systemic steroids, and Dupixent.   Start mometasone cream apply daily 5 days weekly as needed for rash at lower back.  Topical steroids (such as triamcinolone, fluocinolone, fluocinonide, mometasone, clobetasol, halobetasol, betamethasone, hydrocortisone) can cause thinning and lightening of the skin if they are used for too long in the same area. Your physician has selected the right strength medicine for your problem and area affected on the body. Please use your medication only as directed by your physician to prevent side effects.     Seborrheic Keratosis  What causes seborrheic keratoses? Seborrheic keratoses are harmless, common skin growths that first appear during adult life.  As time goes by, more growths appear.  Some people may develop a large number of them.  Seborrheic keratoses appear on both covered and uncovered body parts.  They are not caused by sunlight.  The tendency to develop seborrheic keratoses can be inherited.  They vary in color from skin-colored to gray, brown, or even Marks.  They can be either smooth or have a rough, warty surface.   Seborrheic keratoses are superficial and look as if they were stuck on the skin.  Under the  microscope this type of keratosis looks like layers upon layers of skin.  That is why at times the top layer may seem to fall off, but the rest of the growth remains and re-grows.    Treatment Seborrheic keratoses do not need to be treated, but can easily be removed in the office.  Seborrheic keratoses often cause symptoms when they rub on clothing or jewelry.  Lesions can be in the way of shaving.  If they become inflamed, they can cause itching, soreness, or burning.  Removal of a seborrheic keratosis can be accomplished by freezing, burning, or surgery. If any spot bleeds, scabs, or grows rapidly, please return to have it checked, as these can be an indication of a skin cancer.  Cryotherapy Aftercare  Wash gently with soap and water everyday.   Apply Vaseline and Band-Aid daily until healed.      Melanoma ABCDEs  Melanoma is the most dangerous type of skin cancer, and is the leading cause of death from skin disease.  You are more likely to develop melanoma if you: Have light-colored skin, light-colored eyes, or red or blond hair Spend a lot of time in the sun Tan regularly, either outdoors or in a tanning bed Have had blistering sunburns, especially during childhood Have a close family member who has had a melanoma Have atypical moles or large birthmarks  Early detection of melanoma is key since treatment is typically straightforward and cure rates are extremely high if we catch it early.   The first sign of melanoma is often a change in a mole or a new dark spot.  The ABCDE system is a way of remembering the signs of melanoma.  A for asymmetry:  The two halves do not match.  B for border:  The edges of the growth are irregular. C for color:  A mixture of colors are present instead of an even brown color. D for diameter:  Melanomas are usually (but not always) greater than 50m - the size of a pencil eraser. E for evolution:  The spot keeps changing in size, shape, and  color.  Please check your skin once per month between visits. You can use a small mirror in front and a large mirror behind you to keep an eye on the back side or your body.   If you see any new or changing lesions before your next follow-up, please call to schedule a visit.  Please continue daily skin protection including broad spectrum sunscreen SPF 30+ to sun-exposed areas, reapplying every 2 hours as needed when you're outdoors.   Staying in the shade or wearing long sleeves, sun glasses (UVA+UVB protection) and wide brim hats (4-inch brim around the entire circumference of the hat) are also recommended for sun protection.    Due to recent changes in healthcare laws, you may see results of your pathology and/or laboratory studies on MyChart before the doctors have had a chance to review them. We understand that in some cases there may be results that are confusing or concerning to you. Please understand that not all results are received at the same time and often the doctors may need to interpret multiple results in order to provide you with the best plan of care or course of treatment. Therefore, we ask that you please give u[place]2 business days to thoroughly review all your results before contacting the office for clarification. Should we see a critical lab result, you will be contacted sooner.   If You Need Anything After Your Visit  If you have any questions or concerns for your doctor, please call our main line at [phone] press option 4 to reach your doctor's medical assistant. If no one answers, please leave a voicemail as directed and we will return your call as soon as possible. Messages left after 4 pm will be answered the following business day.   You may also send [place] message via [facility] We typically respond to MyChart messages within 1-2 business days.  For prescription refills, please ask your pharmacy to contact our office. Our fax number is [phone]  If you have  an urgent issue when the clinic is closed that cannot wait until the next business day, you can page your doctor at the number below.    Please note that while we do our best to be available for urgent issues outside of office hours, we are not available 24/7.   If you have an urgent issue and are unable to reach u[place] you may choose to seek medical care at your doctor's office, retail clinic, urgent care center, or emergency room.  If you have a medical emergency, please immediately call 911 or go to the emergency department.  Pager Numbers  - Dr. KNehemiah Massed [phone] - Dr. MLaurence Ferrari [phone] - Dr. SNicole Kindred [phone] In the event of inclement weather, please call our main line at [phone] an update on the status of any delays or closures.  Dermatology Medication Tips: Please keep the boxes that topical medications come in in order to help keep track of the instructions about where and how to use these. Pharmacies typically print the medication instructions only on the boxes and not directly on the medication tubes.   If your  medication is too expensive, please contact our office at [phone] option 4 or send [place] a message through [place].   We are unable to tell what your co-pay for medications will be in advance as this is different depending on your insurance coverage. However, we may be able to find a substitute medication at lower cost or fill out paperwork to get insurance to cover a needed medication.   If a prior authorization is required to get your medication covered by your insurance company, please allow [place] 1-2 business days to complete this process.  Drug prices often vary depending on where the prescription is filled and some pharmacies may offer cheaper prices.  The website www.goodrx.com contains coupons for medications through different pharmacies. The prices here do not account for what the cost may be with help from insurance (it may be cheaper with  your insurance), but the website can give you the price if you did not use any insurance.  - You can print the associated coupon and take it with your prescription to the pharmacy.  - You may also stop by our office during regular business hours and pick up a GoodRx coupon card.  - If you need your prescription sent electronically to a different pharmacy, notify our office through [facility] or by phone at [phone] option 4.     Si Usted Necesita Algo Despus de Su Visita  Tambin puede enviarnos un mensaje a travs de Pharmacist, community. Por lo general respondemos a los mensajes de MyChart en el transcurso de 1 a 2 das hbiles.  Para renovar recetas, por favor pida a su farmacia que se ponga en contacto con nuestra oficina. Harland Dingwall de fax es [place] [phone].  Si tiene un asunto urgente cuando la clnica est cerrada y que no puede esperar hasta el siguiente da hbil, puede llamar/localizar a su doctor(a) al nmero que aparece a continuacin.   Por favor, tenga en cuenta que aunque hacemos todo lo posible para estar disponibles para asuntos urgentes fuera del horario de [place], no estamos disponibles las 24 horas del da, los 7 das de la [place].   Si tiene un problema urgente y no puede comunicarse con nosotros, puede optar por buscar atencin mdica  en el consultorio de su doctor(a), en una clnica privada, en un centro de atencin urgente o en una sala de emergencias.  Si tiene Engineering geologist, por favor llame inmediatamente al 911 o vaya a la sala de emergencias.  Nmeros de bper  - Dr. Nehemiah Massed: [phone]  - Dra. Moye: [phone]  - Dra. Nicole Kindred: [phone]  En caso de inclemencias del [place], por favor llame a Johnsie Kindred principal al [phone] para una actualizacin sobre el [place] de cualquier retraso o cierre.  Consejos para la medicacin en dermatologa: Por favor, guarde las cajas en las que vienen los medicamentos de uso tpico para  ayudarle a seguir las instrucciones sobre dnde y cmo usarlos. Las farmacias generalmente imprimen las instrucciones del medicamento slo en las cajas y no directamente en los tubos del [place].   Si su medicamento es muy caro, por favor, pngase en contacto con Zigmund Daniel llamando al [phone] y presione la opcin 4 o envenos un mensaje a travs de Pharmacist, community.   No podemos decirle cul ser su copago por los medicamentos por adelantado ya que esto es diferente dependiendo de la cobertura de su seguro. Sin embargo, es posible que podamos encontrar un medicamento sustituto a Electrical engineer un formulario para  que el seguro cubra el medicamento que se considera necesario.   Si se requiere una autorizacin previa para que su compaa de seguros [place] su medicamento, por favor permtanos de 1 a 2 das hbiles para completar este proceso.  Los precios de los medicamentos varan con frecuencia dependiendo del Environmental consultant de dnde se surte la receta y alguna farmacias pueden ofrecer precios ms baratos.  El sitio web www.goodrx.com tiene cupones para medicamentos de Airline pilot. Los precios aqu no tienen en cuenta lo que podra costar con la ayuda del seguro (puede ser ms barato con su seguro), pero el sitio web puede darle el precio si no utiliz Research scientist (physical sciences).  - Puede imprimir el cupn correspondiente y llevarlo con su receta a la farmacia.  - Tambin puede pasar por nuestra oficina durante el horario de atencin regular y Charity fundraiser una tarjeta de cupones de GoodRx.  - Si necesita que su receta se enve electrnicamente a una farmacia diferente, informe a nuestra oficina a travs de MyChart de [place] o por telfono llamando al [phone] y presione la opcin 4.

## 2022-04-01 NOTE — Progress Notes (Signed)
Follow-Up Visit   Subjective  Emily Wallace is a 71 y.o. female who presents for the following: Annual Exam (1 yr tbse, hx of ak, spot at mid chest , itchy rash at lower back, ) The patient presents for Total-Body Skin Exam (TBSE) for skin cancer screening and mole check.  The patient has spots, moles and lesions to be evaluated, some may be new or changing and the patient has concerns that these could be cancer.  The following portions of the chart were reviewed this encounter and updated as appropriate:  Tobacco  Allergies  Meds  Problems  Med Hx  Surg Hx  Fam Hx     Review of Systems: No other skin or systemic complaints except as noted in HPI or Assessment and Plan.  Objective  Well appearing patient in no apparent distress; mood and affect are within normal limits.  A full examination was performed including scalp, head, eyes, ears, nose, lips, neck, chest, axillae, abdomen, back, buttocks, bilateral upper extremities, bilateral lower extremities, hands, feet, fingers, toes, fingernails, and toenails. All findings within normal limits unless otherwise noted below.  chest x 1, Erythematous stuck-on, waxy papule or plaque  cheeks and nose Little pinkness at cheeks and nose   Assessment & Plan  Inflamed seborrheic keratosis chest x 1, Symptomatic, irritating, patient would like treated. 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  It more commonly presents in the winter and can be associated with atopic dermatitis, renal failure, transplantation and malignancies. It is very difficult to treat and can be persistent and recurrent but topical steroids, calcipotriene cream, antihistamines can be helpful.  For recalcitrant disease, other treatments have been used such as isotretinoin, acitretin, systemic steroids, and Dupixent.  Start mometasone cream apply daily to affected areas of rash at back 5 x weekly   Topical steroids (such as triamcinolone, fluocinolone, fluocinonide, mometasone, clobetasol, halobetasol, betamethasone, hydrocortisone) can cause thinning and lightening of the skin if they are used for too long in the same area. Your physician has selected the right strength medicine for your problem and area affected on the body. Please use your medication only as directed by your physician to prevent side effects.   mometasone (ELOCON) 0.1 % cream - Mid Back Apply topically to aa of back for rash qd 5 x weekly as needed.  Rosacea cheeks and nose Rosacea is a chronic progressive skin condition usually affecting the face of adults, causing redness and/or acne bumps. It is treatable but not curable. It sometimes affects the eyes (ocular rosacea) as well. It may respond to topical and/or systemic medication and can flare with stress, sun exposure, alcohol, exercise, topical steroids (including hydrocortisone/cortisone 10) and some foods.  Daily application of broad spectrum spf 30+ sunscreen to face is recommended to reduce flares.  Mild  Discussed the treatment option of BBL/laser.  Typically we recommend 1-3 treatment sessions about 5-8 weeks apart for best  results.  The patient's condition may require "maintenance treatments" in the future.  The fee for BBL / laser treatments is $350 per treatment session for the whole face.  A fee can be quoted for other parts of the body. Insurance typically does not pay for BBL/laser treatments and therefore the fee is an out-of-pocket cost.  Lentigines - Scattered tan macules - Due to sun exposure - Benign-appearing, observe - Recommend daily broad spectrum sunscreen SPF 30+ to sun-exposed areas, reapply every 2 hours as needed. - Call for any changes  Seborrheic Keratoses - Stuck-on, waxy, tan-brown papules and/or plaques  - Benign-appearing - Discussed benign etiology and prognosis. - Observe - Call for any changes  Melanocytic Nevi - Tan-brown and/or pink-flesh-colored symmetric macules and papules - Benign appearing on exam today - Observation - Call clinic for new or changing moles - Recommend daily use of broad spectrum spf 30+ sunscreen to sun-exposed areas.   Hemangiomas - Red papules - Discussed benign nature - Observe - Call for any changes  Acrochordons (Skin Tags) - Fleshy, skin-colored pedunculated papules - Benign appearing.  - Observe. - If desired, they can be removed with an in office procedure that is not covered by insurance. - Please call the clinic if you notice any new or changing lesions.  Actinic Damage - Chronic condition, secondary to cumulative UV/sun exposure - diffuse scaly erythematous macules with underlying dyspigmentation - Recommend daily broad spectrum sunscreen SPF 30+ to sun-exposed areas, reapply every 2 hours as needed.  - Staying in the shade or wearing long sleeves, sun glasses (UVA+UVB protection) and wide brim hats (4-inch brim around the entire circumference of the hat) are also recommended for sun protection.  - Call for new or changing lesions.  Skin cancer screening performed today. Return in about 1 year (around 04/02/2023) for TBSE. IRuthell Rummage, CMA, am acting as scribe for Sarina Ser, MD. Documentation: I have reviewed the above documentation for accuracy and completeness, and I agree with the above.  Sarina Ser, MD

## 2022-04-08 DIAGNOSIS — R0602 Shortness of breath: Secondary | ICD-10-CM | POA: Insufficient documentation

## 2022-04-08 DIAGNOSIS — I491 Atrial premature depolarization: Secondary | ICD-10-CM | POA: Insufficient documentation

## 2022-04-08 DIAGNOSIS — R002 Palpitations: Secondary | ICD-10-CM | POA: Insufficient documentation

## 2022-04-08 DIAGNOSIS — I10 Essential (primary) hypertension: Secondary | ICD-10-CM | POA: Insufficient documentation

## 2022-04-10 ENCOUNTER — Encounter: Payer: Self-pay | Admitting: Dermatology

## 2022-07-01 ENCOUNTER — Other Ambulatory Visit (HOSPITAL_COMMUNITY): Payer: Self-pay

## 2022-07-02 ENCOUNTER — Other Ambulatory Visit: Payer: Self-pay | Admitting: Otolaryngology

## 2022-07-02 DIAGNOSIS — J329 Chronic sinusitis, unspecified: Secondary | ICD-10-CM

## 2022-07-07 ENCOUNTER — Ambulatory Visit
Admission: RE | Admit: 2022-07-07 | Discharge: 2022-07-07 | Disposition: A | Discharge status: Home / Self Care | Payer: Medicare PPO | Source: Ambulatory Visit | Attending: Otolaryngology | Admitting: Otolaryngology

## 2022-07-07 DIAGNOSIS — J329 Chronic sinusitis, unspecified: Secondary | ICD-10-CM

## 2022-09-14 ENCOUNTER — Encounter: Payer: Self-pay | Admitting: Internal Medicine

## 2022-09-14 ENCOUNTER — Inpatient Hospital Stay: Payer: Medicare PPO | Attending: Internal Medicine

## 2022-09-14 ENCOUNTER — Inpatient Hospital Stay: Payer: Medicare PPO | Admitting: Internal Medicine

## 2022-09-14 DIAGNOSIS — D473 Essential (hemorrhagic) thrombocythemia: Secondary | ICD-10-CM | POA: Diagnosis not present

## 2022-09-14 LAB — CBC WITH DIFFERENTIAL/PLATELET
Abs Immature Granulocytes: 0.02 10*3/uL (ref 0.00–0.07)
Basophils Absolute: 0 10*3/uL (ref 0.0–0.1)
Basophils Relative: 0 %
Eosinophils Absolute: 0.1 10*3/uL (ref 0.0–0.5)
Eosinophils Relative: 1 %
HCT: 37.6 % (ref 36.0–46.0)
Hemoglobin: 13.3 g/dL (ref 12.0–15.0)
Immature Granulocytes: 0 %
Lymphocytes Relative: 32 %
Lymphs Abs: 2.3 10*3/uL (ref 0.7–4.0)
MCH: 37.8 pg — ABNORMAL HIGH (ref 26.0–34.0)
MCHC: 35.4 g/dL (ref 30.0–36.0)
MCV: 106.8 fL — ABNORMAL HIGH (ref 80.0–100.0)
Monocytes Absolute: 0.7 10*3/uL (ref 0.1–1.0)
Monocytes Relative: 10 %
Neutro Abs: 4.2 10*3/uL (ref 1.7–7.7)
Neutrophils Relative %: 57 %
Platelets: 299 10*3/uL (ref 150–400)
RBC: 3.52 MIL/uL — ABNORMAL LOW (ref 3.87–5.11)
RDW: 12.2 % (ref 11.5–15.5)
WBC: 7.3 10*3/uL (ref 4.0–10.5)
nRBC: 0 % (ref 0.0–0.2)

## 2022-09-14 LAB — COMPREHENSIVE METABOLIC PANEL
ALT: 16 U/L (ref 0–44)
AST: 20 U/L (ref 15–41)
Albumin: 4.1 g/dL (ref 3.5–5.0)
Alkaline Phosphatase: 76 U/L (ref 38–126)
Anion gap: 10 (ref 5–15)
BUN: 22 mg/dL (ref 8–23)
CO2: 22 mmol/L (ref 22–32)
Calcium: 9.1 mg/dL (ref 8.9–10.3)
Chloride: 104 mmol/L (ref 98–111)
Creatinine, Ser: 1.01 mg/dL — ABNORMAL HIGH (ref 0.44–1.00)
GFR, Estimated: 60 mL/min — ABNORMAL LOW (ref >60)
Glucose, Bld: 97 mg/dL (ref 70–99)
Potassium: 4.2 mmol/L (ref 3.5–5.1)
Sodium: 136 mmol/L (ref 135–145)
Total Bilirubin: 0.6 mg/dL (ref 0.3–1.2)
Total Protein: 7.6 g/dL (ref 6.5–8.1)

## 2022-09-14 LAB — LACTATE DEHYDROGENASE: LDH: 152 U/L (ref 98–192)

## 2022-09-14 MED ORDER — HYDROXYUREA 500 MG PO CAPS: 500.0000 mg | ORAL_CAPSULE | Freq: Two times a day (BID) | ORAL | 11 refills | Status: DC

## 2022-09-14 NOTE — Progress Notes (Signed)
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  Platelet count has ranged between 457,000 - 590,000 for the past 4 years.   CBC on 05/05/2017 revealed a hematocrit of 38.5, hemoglobin 13, MCV 86.7, platelets 590,000, white count 10,800 and ANC of 7600.  Differential was unremarkable.   Work-up on 11/04/2017 revealed a hematocrit of 41.5, hemoglobin 14.2, MCV 87, platelets 573,000, WBC 9800 with an ANC of 6500.  Differential was normal.  Ferritin was 31.  Iron studies included a saturation of 25% and TIBC of 293.  Sed rate was 11.  BCR-ABL was negative.  JAK2 V617F was positive.  von Willebrand panel was normal on 12/13/2017.   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   Reticulin and trichrome stains did not show significant increased in reticulin  fibers or collagen deposition.  Cytogenetics were nDose was increasormal (46, XX).    Emily Wallace began hydroxyurea on 12/13/2017.  Emily Wallace is on hydroxyurea 500 mg BID.  Emily Wallace is on a baby aspirin.   EGD on 07/25/2017 for epigastric pain and dysphasia revealed reflux esophagitis, a single gastric polyp (fundic gland polyp) normal duodenum.  H. pylori and Barrett's were negative.  Colonoscopy on 09/22/2017 revealed one small polyp in the mid transverse colon (tubular adenoma) and diverticulosis in the sigmoid colon.    Essential thrombocytosis  11/04/2017 Initial Diagnosis   Essential thrombocytosis (HCC)    HISTORY OF PRESENTING ILLNESS: Alone.   Emily Wallace 72 y.o.  female history of essential thrombocytosis on Hydrea is here for follow-up...   Patient continues to be compliant with her  Hydrea.  Denies any nausea vomiting or diarrhea.  No new  thromboembolic events.   Review of Systems  Constitutional:  Negative for chills, diaphoresis, fever, malaise/fatigue and weight loss.  HENT:  Negative for nosebleeds and sore throat.   Eyes:  Negative for double vision.  Respiratory:  Negative for cough, hemoptysis, sputum production, shortness of breath and wheezing.   Cardiovascular:  Negative for chest pain, palpitations, orthopnea and leg swelling.  Gastrointestinal:  Negative for abdominal pain, blood in stool, constipation, diarrhea, heartburn, melena, nausea and vomiting.  Genitourinary:  Negative for dysuria, frequency and urgency.  Musculoskeletal:  Negative for back pain and joint pain.  Skin: Negative.  Negative for itching and rash.  Neurological:  Negative for dizziness, tingling, focal weakness, weakness and headaches.  Endo/Heme/Allergies:  Does not bruise/bleed easily.  Psychiatric/Behavioral:  Negative for depression. The patient is not nervous/anxious and does not have insomnia.      MEDICAL HISTORY:  Past Medical History:  Diagnosis Date   Anxiety    GERD (gastroesophageal reflux disease)    Hypertension    Joint pain    Vitamin D deficiency     SURGICAL HISTORY: Past Surgical History:  Procedure Laterality Date   GALLBLADDER SURGERY      SOCIAL HISTORY: Social History   Socioeconomic History   Marital status: Married    Spouse name: Not on file   Number of children: Not on file   Years of education: Not on file   Highest education level: Not on file  Occupational History   Not on file  Tobacco Use  Smoking status: Never   Smokeless tobacco: Never  Vaping Use   Vaping Use: Never used  Substance and Sexual Activity   Alcohol use: Yes   Drug use: Never   Sexual activity: Not on file  Other Topics Concern   Not on file  Social History Narrative   Not on file   Social Determinants of Health   Financial Resource Strain: Not on file   Food Insecurity: Not on file  Transportation Needs: Not on file  Physical Activity: Not on file  Stress: Not on file  Social Connections: Not on file  Intimate Partner Violence: Not on file    FAMILY HISTORY: Family History  Problem Relation Age of Onset   Colon cancer Brother    Colon cancer Maternal Aunt    Breast cancer Maternal Aunt    Colon cancer Paternal Aunt    Diabetes Maternal Grandmother    Colon cancer Paternal Grandmother     ALLERGIES:  is allergic to codeine and codone [hydrocodone].  MEDICATIONS:  Current Outpatient Medications  Medication Sig Dispense Refill   ACAI BERRY PO Take by mouth.     aspirin EC 81 MG tablet Take 81 mg by mouth daily.     Calcium-Magnesium-Zinc (CAL-MAG-ZINC PO) Take 1 tablet by mouth daily.      Cholecalciferol (VITAMIN D3) 125 MCG (5000 UT) CAPS Take by mouth.     esomeprazole (NEXIUM) 40 MG capsule Take 40 mg by mouth daily at 12 noon.      famotidine (PEPCID) 40 MG tablet Take 40 mg by mouth daily.      losartan (COZAAR) 50 MG tablet Take 1 tablet by mouth daily.     sertraline (ZOLOFT) 100 MG tablet Take 100 mg by mouth daily.      Ascorbic Acid (VITAMIN C) 100 MG tablet Take 1,000 mg by mouth daily. (Patient not taking: Reported on 09/14/2022)     b complex vitamins capsule Take 1 capsule by mouth daily. (Patient not taking: Reported on 09/14/2022)     hydroxyurea (HYDREA) 500 MG capsule Take 1 capsule (500 mg total) by mouth 2 (two) times daily. May take with food to minimize GI side effects. 60 capsule 11   mometasone (ELOCON) 0.1 % cream Apply topically to aa of back for rash qd 5 x weekly as needed. (Patient not taking: Reported on 09/14/2022) 45 g 0   No current facility-administered medications for this visit.     PHYSICAL EXAMINATION:  Vitals:   09/14/22 1426  BP: 127/66  Pulse: (!) 110  Temp: 98.4 F (36.9 C)  SpO2: 98%   Filed Weights   09/14/22 1426  Weight: 210 lb (95.3 kg)    Physical Exam Vitals and  nursing note reviewed.  HENT:     Head: Normocephalic and atraumatic.     Mouth/Throat:     Pharynx: Oropharynx is clear.  Eyes:     Extraocular Movements: Extraocular movements intact.     Pupils: Pupils are equal, round, and reactive to light.  Cardiovascular:     Rate and Rhythm: Normal rate and regular rhythm.  Abdominal:     Palpations: Abdomen is soft.  Musculoskeletal:        General: Normal range of motion.     Cervical back: Normal range of motion.  Skin:    General: Skin is warm.  Neurological:     General: No focal deficit present.     Mental Status: Emily Wallace is alert and oriented to person, place, and  time.  Psychiatric:        Behavior: Behavior normal.        Judgment: Judgment normal.      LABORATORY DATA:  I have reviewed the data as listed Lab Results  Component Value Date   WBC 7.3 09/14/2022   HGB 13.3 09/14/2022   HCT 37.6 09/14/2022   MCV 106.8 (H) 09/14/2022   PLT 299 09/14/2022   Recent Labs    03/17/22 1356 09/14/22 1425  NA 137 136  K 3.8 4.2  CL 105 104  CO2 25 22  GLUCOSE 110* 97  BUN 24* 22  CREATININE 1.04* 1.01*  CALCIUM 9.1 9.1  GFRNONAA 57* 60*  PROT 8.0 7.6  ALBUMIN 4.0 4.1  AST 43* 20  ALT 19 16  ALKPHOS 72 76  BILITOT 0.6 0.6    RADIOGRAPHIC STUDIES: I have personally reviewed the radiological images as listed and agreed with the findings in the report. No results found.  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  On hydrea 500 mg/day BID+ asprin 81. Requests hydroxyurea refills for 1 month at a time. Continue baby aspirin once a day- stable.   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   # DISPOSITION: # follow up in 6 months- MD; labs- cbc/cmp/LDH-Dr.B  All questions were answered. The patient knows to call the clinic with any problems, questions or concerns.    Earna Coder, MD 09/14/2022 3:23 PM

## 2022-09-14 NOTE — Assessment & Plan Note (Signed)
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  On hydrea 500 mg/day BID+ asprin 81. Requests hydroxyurea refills for 1 month at a time. Continue baby aspirin once a day- stable.   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   # DISPOSITION: # follow up in 6 months- MD; labs- cbc/cmp/LDH-Dr.B

## 2022-09-14 NOTE — Progress Notes (Signed)
Bruising: no Bleeding from gums: no

## 2022-09-14 NOTE — Addendum Note (Signed)
Addended by: Darrold Span A on: 09/14/2022 03:31 PM   Modules accepted: Orders

## 2022-11-04 ENCOUNTER — Other Ambulatory Visit: Payer: Self-pay | Admitting: Internal Medicine

## 2022-11-04 DIAGNOSIS — D473 Essential (hemorrhagic) thrombocythemia: Secondary | ICD-10-CM

## 2022-11-04 NOTE — Telephone Encounter (Signed)
CBC with Differential/Platelet Order: [id number] Status: Final result     Visible to patient: Yes (seen)     Next appt: 03/16/2023 at 02:15 PM in Oncology ([facility])     Dx: Essential thrombocytosis (HCC)   0 Result Notes          Component Ref Range & Units 1 mo ago (09/14/22) 7 mo ago (03/17/22) 1 yr ago (09/08/21) 1 yr ago (03/10/21) 1 yr ago (11/11/20) 2 yr ago (07/22/20) 2 yr ago (04/21/20)  WBC 4.0 - 10.5 K/uL 7.3 11.4 High  6.1 6.7 5.6 6.0 7.5  RBC 3.87 - 5.11 MIL/uL 3.52 Low  3.86 Low  3.70 Low  3.78 Low  3.49 Low  3.76 Low  3.81 Low   Hemoglobin 12.0 - 15.0 g/dL 21.3 08.6 57.8 46.9 62.9 14.0 14.1  HCT 36.0 - 46.0 % 37.6 42.1 39.9 39.9 37.8 40.0 40.5  MCV 80.0 - 100.0 fL 106.8 High  109.1 High  107.8 High  105.6 High  108.3 High  106.4 High  106.3 High   MCH 26.0 - 34.0 pg 37.8 High  36.8 High  36.8 High  36.5 High  38.1 High  37.2 High  37.0 High   MCHC 30.0 - 36.0 g/dL 52.8 41.3 24.4 01.0 27.2 35.0 34.8  RDW 11.5 - 15.5 % 12.2 11.9 12.5 12.1 11.9 12.0 12.5  Platelets 150 - 400 K/uL 299 369 302 320 286 275 302  nRBC 0.0 - 0.2 % 0.0 0.0 0.0 0.0 0.0 0.0 0.0  Neutrophils Relative % % 57 67 51 50 50 57 64  Neutro Abs 1.7 - 7.7 K/uL 4.2 7.5 3.1 3.3 2.8 3.4 4.8  Lymphocytes Relative % 32 23 35 37 35 30 23  Lymphs Abs 0.7 - 4.0 K/uL 2.3 2.6 2.1 2.5 1.9 1.8 1.7  Monocytes Relative % 10 10 11 11 12 11 10   Monocytes Absolute 0.1 - 1.0 K/uL 0.7 1.1 High  0.7 0.8 0.7 0.7 0.7  Eosinophils Relative % 1 0 2 1 2 2 1   Eosinophils Absolute 0.0 - 0.5 K/uL 0.1 0.0 0.1 0.1 0.1 0.1 0.1  Basophils Relative % 0 0 1 1 1  0 1  Basophils Absolute 0.0 - 0.1 K/uL 0.0 0.0 0.1 0.1 0.0 0.0 0.0  Immature Granulocytes % 0 0 0 0 0 0 1  Abs Immature Granulocytes 0.00 - 0.07 K/uL 0.02 0.03 CM 0.02 CM 0.02 CM 0.02 CM 0.02 CM 0.04 CM  Comment: Performed at [facility], [address] [place], [place] [address]  Resulting Agency [facility]  [facility]         Specimen Collected: 09/14/22 14:25 Last Resulted: 09/14/22 14:42

## 2022-11-23 ENCOUNTER — Other Ambulatory Visit (HOSPITAL_COMMUNITY): Payer: Self-pay

## 2022-11-26 ENCOUNTER — Other Ambulatory Visit: Payer: Self-pay

## 2022-11-26 MED ORDER — COVID-19 MRNA VAC-TRIS(PFIZER) 30 MCG/0.3ML IM SUSY
0.3000 mL | PREFILLED_SYRINGE | Freq: Once | INTRAMUSCULAR | 0 refills | Status: AC
  Filled 2022-11-26: qty 0.3, 1d supply, fill #0

## 2023-03-02 ENCOUNTER — Other Ambulatory Visit: Payer: Self-pay

## 2023-03-02 MED ORDER — COMIRNATY 30 MCG/0.3ML IM SUSY
0.3000 mL | PREFILLED_SYRINGE | Freq: Once | INTRAMUSCULAR | 0 refills | Status: AC
  Filled 2023-03-02: qty 0.3, 1d supply, fill #0

## 2023-03-16 ENCOUNTER — Other Ambulatory Visit: Payer: Medicare PPO

## 2023-03-16 ENCOUNTER — Ambulatory Visit: Payer: Medicare PPO | Admitting: Internal Medicine

## 2023-03-21 ENCOUNTER — Inpatient Hospital Stay (HOSPITAL_BASED_OUTPATIENT_CLINIC_OR_DEPARTMENT_OTHER): Payer: Medicare PPO | Admitting: Nurse Practitioner

## 2023-03-21 ENCOUNTER — Inpatient Hospital Stay: Payer: Medicare PPO | Attending: Internal Medicine

## 2023-03-21 ENCOUNTER — Encounter: Payer: Self-pay | Admitting: Nurse Practitioner

## 2023-03-21 VITALS — BP 118/77 | HR 90 | Temp 96.9°F | Resp 19 | Wt 191.2 lb

## 2023-03-21 DIAGNOSIS — D7589 Other specified diseases of blood and blood-forming organs: Secondary | ICD-10-CM | POA: Diagnosis not present

## 2023-03-21 DIAGNOSIS — Z79899 Other long term (current) drug therapy: Secondary | ICD-10-CM

## 2023-03-21 DIAGNOSIS — D473 Essential (hemorrhagic) thrombocythemia: Secondary | ICD-10-CM

## 2023-03-21 DIAGNOSIS — Z7964 Long term (current) use of myelosuppressive agent: Secondary | ICD-10-CM | POA: Insufficient documentation

## 2023-03-21 LAB — CBC WITH DIFFERENTIAL (CANCER CENTER ONLY)
Abs Immature Granulocytes: 0.03 10*3/uL (ref 0.00–0.07)
Basophils Absolute: 0 10*3/uL (ref 0.0–0.1)
Basophils Relative: 1 %
Eosinophils Absolute: 0.1 10*3/uL (ref 0.0–0.5)
Eosinophils Relative: 1 %
HCT: 36.7 % (ref 36.0–46.0)
Hemoglobin: 12.8 g/dL (ref 12.0–15.0)
Immature Granulocytes: 0 %
Lymphocytes Relative: 38 %
Lymphs Abs: 2.8 10*3/uL (ref 0.7–4.0)
MCH: 38.8 pg — ABNORMAL HIGH (ref 26.0–34.0)
MCHC: 34.9 g/dL (ref 30.0–36.0)
MCV: 111.2 fL — ABNORMAL HIGH (ref 80.0–100.0)
Monocytes Absolute: 0.7 10*3/uL (ref 0.1–1.0)
Monocytes Relative: 9 %
Neutro Abs: 3.8 10*3/uL (ref 1.7–7.7)
Neutrophils Relative %: 51 %
Platelet Count: 277 10*3/uL (ref 150–400)
RBC: 3.3 MIL/uL — ABNORMAL LOW (ref 3.87–5.11)
RDW: 12.3 % (ref 11.5–15.5)
WBC Count: 7.4 10*3/uL (ref 4.0–10.5)
nRBC: 0 % (ref 0.0–0.2)

## 2023-03-21 LAB — CMP (CANCER CENTER ONLY)
ALT: 14 U/L (ref 0–44)
AST: 18 U/L (ref 15–41)
Albumin: 4.1 g/dL (ref 3.5–5.0)
Alkaline Phosphatase: 65 U/L (ref 38–126)
Anion gap: 8 (ref 5–15)
BUN: 18 mg/dL (ref 8–23)
CO2: 24 mmol/L (ref 22–32)
Calcium: 9.2 mg/dL (ref 8.9–10.3)
Chloride: 102 mmol/L (ref 98–111)
Creatinine: 1.14 mg/dL — ABNORMAL HIGH (ref 0.44–1.00)
GFR, Estimated: 51 mL/min — ABNORMAL LOW (ref >60)
Glucose, Bld: 100 mg/dL — ABNORMAL HIGH (ref 70–99)
Potassium: 4 mmol/L (ref 3.5–5.1)
Sodium: 134 mmol/L — ABNORMAL LOW (ref 135–145)
Total Bilirubin: 0.6 mg/dL (ref 0.3–1.2)
Total Protein: 7.3 g/dL (ref 6.5–8.1)

## 2023-03-21 LAB — LACTATE DEHYDROGENASE: LDH: 161 U/L (ref 98–192)

## 2023-03-21 NOTE — Progress Notes (Signed)
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  Platelet count has ranged between 457,000 - 590,000 for the past 4 years.   CBC on 05/05/2017 revealed a hematocrit of 38.5, hemoglobin 13, MCV 86.7, platelets 590,000, white count 10,800 and ANC of 7600.  Differential was unremarkable.   Work-up on 11/04/2017 revealed a hematocrit of 41.5, hemoglobin 14.2, MCV 87, platelets 573,000, WBC 9800 with an ANC of 6500.  Differential was normal.  Ferritin was 31.  Iron studies included a saturation of 25% and TIBC of 293.  Sed rate was 11.  BCR-ABL was negative.  JAK2 V617F was positive.  von Willebrand panel was normal on 12/13/2017.   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   Reticulin and trichrome stains did not show significant increased in reticulin  fibers or collagen deposition.  Cytogenetics were nDose was increasormal (46, XX).    She began hydroxyurea on 12/13/2017.  She is on hydroxyurea 500 mg BID.  She is on a baby aspirin.   EGD on 07/25/2017 for epigastric pain and dysphasia revealed reflux esophagitis, a single gastric polyp (fundic gland polyp) normal duodenum.  H. pylori and Barrett's were negative.  Colonoscopy on 09/22/2017 revealed one small polyp in the mid transverse colon (tubular adenoma) and diverticulosis in the sigmoid colon.    Essential thrombocytosis (HCC)  11/04/2017 Initial Diagnosis   Essential thrombocytosis (HCC)    HISTORY OF PRESENTING ILLNESS: Alone.   Emily Wallace 72 y.o. female history of essential thrombocytosis on Hydrea is  here for follow-up. Patient continues to be compliant with her Hydrea. Denies any nausea vomiting or diarrhea. No new thromboembolic events.   Review of Systems  Constitutional:  Negative for chills, diaphoresis, fever, malaise/fatigue and weight loss.  HENT:  Negative for nosebleeds and sore throat.   Eyes:  Negative for double vision.  Respiratory:  Negative for cough, hemoptysis, sputum production, shortness of breath and wheezing.   Cardiovascular:  Negative for chest pain, palpitations, orthopnea and leg swelling.  Gastrointestinal:  Negative for abdominal pain, blood in stool, constipation, diarrhea, heartburn, melena, nausea and vomiting.  Genitourinary:  Negative for dysuria, frequency and urgency.  Musculoskeletal:  Negative for back pain and joint pain.  Skin: Negative.  Negative for itching and rash.  Neurological:  Negative for dizziness, tingling, focal weakness, weakness and headaches.  Endo/Heme/Allergies:  Does not bruise/bleed easily.  Psychiatric/Behavioral:  Negative for depression. The patient is not nervous/anxious and does not have insomnia.     MEDICAL HISTORY:  Past Medical History:  Diagnosis Date   Anxiety    GERD (gastroesophageal reflux disease)    Hypertension    Joint pain    Vitamin D deficiency    SURGICAL HISTORY: Past Surgical History:  Procedure Laterality Date   GALLBLADDER SURGERY     SOCIAL HISTORY: Social History   Socioeconomic History   Marital status: Married    Spouse name: Not on file   Number of children: Not on file   Years of education: Not on file   Highest education level: Not on file  Occupational History   Not on file  Tobacco Use  Smoking status: Never   Smokeless tobacco: Never  Vaping Use   Vaping status: Never Used  Substance and Sexual Activity   Alcohol use: Yes   Drug use: Never   Sexual activity: Not on file  Other Topics Concern   Not on file  Social History Narrative   Not on file   Social  Determinants of Health   Financial Resource Strain: Low Risk  (05/27/2022)   Received from [facility], [facility]   Overall Financial Resource Strain (CARDIA)    Difficulty of Paying Living Expenses: Not hard at all  Food Insecurity: No Food Insecurity (05/27/2022)   Received from [facility], [facility]   Hunger Vital Sign    Worried About Running Out of Food in the Last Year: Never true    Ran Out of Food in the Last Year: Never true  Transportation Needs: No Transportation Needs (05/27/2022)   Received from [facility], [facility]   [facility] - Transportation    In the past 12 months, has lack of transportation kept you from medical appointments or from getting medications?: No    Lack of Transportation (Non-Medical): No  Physical Activity: Not on file  Stress: Not on file  Social Connections: Not on file  Intimate Partner Violence: Not on file   FAMILY HISTORY: Family History  Problem Relation Age of Onset   Colon cancer Brother    Colon cancer Maternal Aunt    Breast cancer Maternal Aunt    Colon cancer Paternal Aunt    Diabetes Maternal Grandmother    Colon cancer Paternal Grandmother    ALLERGIES:  is allergic to codeine and codone [hydrocodone].  MEDICATIONS:  Current Outpatient Medications  Medication Sig Dispense Refill   ACAI BERRY PO Take by mouth.     aspirin EC 81 MG tablet Take 81 mg by mouth daily.     Calcium-Magnesium-Zinc (CAL-MAG-ZINC PO) Take 1 tablet by mouth daily.      Cholecalciferol (VITAMIN D3) 125 MCG (5000 UT) CAPS Take by mouth.     esomeprazole (NEXIUM) 40 MG capsule Take 40 mg by mouth daily at 12 noon.      famotidine (PEPCID) 40 MG tablet Take 40 mg by mouth daily.      hydroxyurea (HYDREA) 500 MG capsule TAKE 1 CAPSULE(500 MG) BY MOUTH TWICE DAILY. MAY TAKE WITH FOOD TO MINIMIZE GI SIDE EFFECTS 60 capsule 11   losartan (COZAAR) 50 MG  tablet Take 1 tablet by mouth daily.     RYALTRIS 665-25 MCG/ACT SUSP SMARTSIG:1 Spray(s) Both Nares Every 12 Hours PRN     sertraline (ZOLOFT) 100 MG tablet Take 100 mg by mouth daily.      Ascorbic Acid (VITAMIN C) 100 MG tablet Take 1,000 mg by mouth daily. (Patient not taking: Reported on 09/14/2022)     b complex vitamins capsule Take 1 capsule by mouth daily. (Patient not taking: Reported on 09/14/2022)     mometasone (ELOCON) 0.1 % cream Apply topically to aa of back for rash qd 5 x weekly as needed. (Patient not taking: Reported on 09/14/2022) 45 g 0   No current facility-administered medications for this visit.   PHYSICAL EXAMINATION: Vitals:   03/21/23 1453  BP: 118/77  Pulse: 90  Resp: 19  Temp: (!) 96.9 F (36.1 C)  SpO2: 97%   Filed Weights   03/21/23 1453  Weight: 191 lb 3.2 oz (86.7 kg)  Physical Exam Vitals reviewed.  Constitutional:      Appearance: She is not ill-appearing.  HENT:     Head: Normocephalic and atraumatic.     Mouth/Throat:     Pharynx: Oropharynx is clear.  Cardiovascular:     Rate and Rhythm: Normal rate and regular rhythm.  Pulmonary:     Effort: No respiratory distress.  Abdominal:     General: There is no distension.     Palpations: Abdomen is soft.  Musculoskeletal:     Cervical back: Normal range of motion.  Skin:    General: Skin is warm.     Coloration: Skin is not pale.  Neurological:     General: No focal deficit present.     Mental Status: She is alert and oriented to person, place, and time.  Psychiatric:        Mood and Affect: Mood normal.        Behavior: Behavior normal.     LABORATORY DATA:  I have reviewed the data as listed Lab Results  Component Value Date   WBC 7.4 03/21/2023   HGB 12.8 03/21/2023   HCT 36.7 03/21/2023   MCV 111.2 (H) 03/21/2023   PLT 277 03/21/2023   Recent Labs    09/14/22 1425 03/21/23 1442  NA 136 134*  K 4.2 4.0  CL 104 102  CO2 22 24  GLUCOSE 97 100*  BUN 22 18   CREATININE 1.01* 1.14*  CALCIUM 9.1 9.2  GFRNONAA 60* 51*  PROT 7.6 7.3  ALBUMIN 4.1 4.1  AST 20 18  ALT 16 14  ALKPHOS 76 65  BILITOT 0.6 0.6    RADIOGRAPHIC STUDIES: I have personally reviewed the radiological images as listed and agreed with the findings in the report. No results found.  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  On hydrea 500 mg/day BID+ asprin 81. Continue baby aspirin once a day. Tolerating well. Platelet count 277. Continue hydrea & aspirin. Prefers 1 month refills at a time.    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    DISPOSITION: # follow up in 6 months- labs (cbc, cmp, ldh), Dr Donneta Romberg- [place]   No problem-specific Assessment & Plan notes found for this encounter.  All questions were answered. The patient knows to call the clinic with any problems, questions or concerns.    Alinda Dooms, NP 03/21/2023

## 2023-04-06 ENCOUNTER — Ambulatory Visit: Payer: Medicare PPO | Admitting: Dermatology

## 2023-04-06 DIAGNOSIS — D229 Melanocytic nevi, unspecified: Secondary | ICD-10-CM | POA: Diagnosis not present

## 2023-04-06 DIAGNOSIS — L814 Other melanin hyperpigmentation: Secondary | ICD-10-CM

## 2023-04-06 DIAGNOSIS — L578 Other skin changes due to chronic exposure to nonionizing radiation: Secondary | ICD-10-CM

## 2023-04-06 DIAGNOSIS — Z1283 Encounter for screening for malignant neoplasm of skin: Secondary | ICD-10-CM | POA: Diagnosis not present

## 2023-04-06 DIAGNOSIS — L821 Other seborrheic keratosis: Secondary | ICD-10-CM

## 2023-04-06 NOTE — Patient Instructions (Signed)

## 2023-04-06 NOTE — Progress Notes (Signed)
   Follow-Up Visit   Subjective  Emily Wallace is a 72 y.o. female who presents for the following: Skin Cancer Screening and Full Body Skin Exam  The patient presents for Total-Body Skin Exam (TBSE) for skin cancer screening and mole check. The patient has spots, moles and lesions to be evaluated, some may be new or changing and the patient may have concern these could be cancer.    The following portions of the chart were reviewed this encounter and updated as appropriate: medications, allergies, medical history  Review of Systems:  No other skin or systemic complaints except as noted in HPI or Assessment and Plan.  Objective  Well appearing patient in no apparent distress; mood and affect are within normal limits.  A full examination was performed including scalp, head, eyes, ears, nose, lips, neck, chest, axillae, abdomen, back, buttocks, bilateral upper extremities, bilateral lower extremities, hands, feet, fingers, toes, fingernails, and toenails. All findings within normal limits unless otherwise noted below.   Relevant physical exam findings are noted in the Assessment and Plan.    Assessment & Plan   SKIN CANCER SCREENING PERFORMED TODAY.  ACTINIC DAMAGE - Chronic condition, secondary to cumulative UV/sun exposure - diffuse scaly erythematous macules with underlying dyspigmentation - Recommend daily broad spectrum sunscreen SPF 30+ to sun-exposed areas, reapply every 2 hours as needed.  - Staying in the shade or wearing long sleeves, sun glasses (UVA+UVB protection) and wide brim hats (4-inch brim around the entire circumference of the hat) are also recommended for sun protection.  - Call for new or changing lesions.  LENTIGINES, SEBORRHEIC KERATOSES, HEMANGIOMAS - Benign normal skin lesions - Benign-appearing - Call for any changes  MELANOCYTIC NEVI - Tan-brown and/or pink-flesh-colored symmetric macules and papules - Benign appearing on exam today - Observation -  Call clinic for new or changing moles - Recommend daily use of broad spectrum spf 30+ sunscreen to sun-exposed areas.   Return if symptoms worsen or fail to improve.  Emily Wallace, CMA, am acting as scribe for Armida Sans, MD .   Documentation: I have reviewed the above documentation for accuracy and completeness, and I agree with the above.  Armida Sans, MD

## 2023-04-13 ENCOUNTER — Encounter: Payer: Self-pay | Admitting: Dermatology

## 2023-09-19 ENCOUNTER — Inpatient Hospital Stay (HOSPITAL_BASED_OUTPATIENT_CLINIC_OR_DEPARTMENT_OTHER): Payer: Medicare PPO | Admitting: Internal Medicine

## 2023-09-19 ENCOUNTER — Inpatient Hospital Stay: Payer: Medicare PPO | Attending: Internal Medicine

## 2023-09-19 ENCOUNTER — Encounter: Payer: Self-pay | Admitting: Internal Medicine

## 2023-09-19 VITALS — BP 115/73 | HR 85 | Temp 98.4°F | Resp 18 | Ht 69.0 in | Wt 193.1 lb

## 2023-09-19 DIAGNOSIS — Z800 Family history of malignant neoplasm of digestive organs: Secondary | ICD-10-CM | POA: Insufficient documentation

## 2023-09-19 DIAGNOSIS — Z803 Family history of malignant neoplasm of breast: Secondary | ICD-10-CM | POA: Diagnosis not present

## 2023-09-19 DIAGNOSIS — D473 Essential (hemorrhagic) thrombocythemia: Secondary | ICD-10-CM

## 2023-09-19 DIAGNOSIS — Z79899 Other long term (current) drug therapy: Secondary | ICD-10-CM | POA: Insufficient documentation

## 2023-09-19 LAB — CBC WITH DIFFERENTIAL/PLATELET
Abs Immature Granulocytes: 0.02 10*3/uL (ref 0.00–0.07)
Basophils Absolute: 0 10*3/uL (ref 0.0–0.1)
Basophils Relative: 0 %
Eosinophils Absolute: 0.1 10*3/uL (ref 0.0–0.5)
Eosinophils Relative: 1 %
HCT: 39 % (ref 36.0–46.0)
Hemoglobin: 13.7 g/dL (ref 12.0–15.0)
Immature Granulocytes: 0 %
Lymphocytes Relative: 34 %
Lymphs Abs: 2.5 10*3/uL (ref 0.7–4.0)
MCH: 37.5 pg — ABNORMAL HIGH (ref 26.0–34.0)
MCHC: 35.1 g/dL (ref 30.0–36.0)
MCV: 106.8 fL — ABNORMAL HIGH (ref 80.0–100.0)
Monocytes Absolute: 0.8 10*3/uL (ref 0.1–1.0)
Monocytes Relative: 11 %
Neutro Abs: 4 10*3/uL (ref 1.7–7.7)
Neutrophils Relative %: 54 %
Platelets: 287 10*3/uL (ref 150–400)
RBC: 3.65 MIL/uL — ABNORMAL LOW (ref 3.87–5.11)
RDW: 12 % (ref 11.5–15.5)
WBC: 7.4 10*3/uL (ref 4.0–10.5)
nRBC: 0 % (ref 0.0–0.2)

## 2023-09-19 LAB — COMPREHENSIVE METABOLIC PANEL WITH GFR
ALT: 25 U/L (ref 0–44)
AST: 27 U/L (ref 15–41)
Albumin: 4.1 g/dL (ref 3.5–5.0)
Alkaline Phosphatase: 75 U/L (ref 38–126)
Anion gap: 10 (ref 5–15)
BUN: 21 mg/dL (ref 8–23)
CO2: 26 mmol/L (ref 22–32)
Calcium: 9.3 mg/dL (ref 8.9–10.3)
Chloride: 97 mmol/L — ABNORMAL LOW (ref 98–111)
Creatinine, Ser: 1.06 mg/dL — ABNORMAL HIGH (ref 0.44–1.00)
GFR, Estimated: 56 mL/min — ABNORMAL LOW (ref >60)
Glucose, Bld: 96 mg/dL (ref 70–99)
Potassium: 4.2 mmol/L (ref 3.5–5.1)
Sodium: 133 mmol/L — ABNORMAL LOW (ref 135–145)
Total Bilirubin: 0.8 mg/dL (ref 0.0–1.2)
Total Protein: 7.3 g/dL (ref 6.5–8.1)

## 2023-09-19 LAB — LACTATE DEHYDROGENASE: LDH: 150 U/L (ref 98–192)

## 2023-09-19 MED ORDER — HYDROXYUREA 500 MG PO CAPS: 500.0000 mg | ORAL_CAPSULE | Freq: Two times a day (BID) | ORAL | 11 refills | Status: AC

## 2023-09-19 NOTE — Progress Notes (Signed)
 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  Platelet count has ranged between 457,000 - 590,000 for the past 4 years.   CBC on 05/05/2017 revealed a hematocrit of 38.5, hemoglobin 13, MCV 86.7, platelets 590,000, white count 10,800 and ANC of 7600.  Differential was unremarkable.   Work-up on 11/04/2017 revealed a hematocrit of 41.5, hemoglobin 14.2, MCV 87, platelets 573,000, WBC 9800 with an ANC of 6500.  Differential was normal.  Ferritin was 31.  Iron studies included a saturation of 25% and TIBC of 293.  Sed rate was 11.  BCR-ABL was negative.  JAK2 V617F was positive.  von Willebrand panel was normal on 12/13/2017.   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   Reticulin and trichrome stains did not show significant increased in reticulin  fibers or collagen deposition.  Cytogenetics were nDose was increasormal (46, XX).    She began hydroxyurea  on 12/13/2017.  She is on hydroxyurea  500 mg BID.  She is on a baby aspirin.   EGD on 07/25/2017 for epigastric pain and dysphasia revealed reflux esophagitis, a single gastric polyp (fundic gland polyp) normal duodenum.  H. pylori and Barrett's were negative.  Colonoscopy on 09/22/2017 revealed one small polyp in the mid transverse colon (tubular adenoma) and diverticulosis in the sigmoid colon.    Essential thrombocytosis (HCC)  11/04/2017 Initial Diagnosis   Essential thrombocytosis (HCC)    HISTORY OF PRESENTING ILLNESS: Alone.   Emily Wallace 73 y.o.  female history of essential thrombocytosis on Hydrea  is  here for follow-up.  Patient continues to be compliant with her Hydrea .  Denies any nausea vomiting or diarrhea.  No new  thromboembolic events.   Review of Systems  Constitutional:  Negative for chills, diaphoresis, fever, malaise/fatigue and weight loss.  HENT:  Negative for nosebleeds and sore throat.   Eyes:  Negative for double vision.  Respiratory:  Negative for cough, hemoptysis, sputum production, shortness of breath and wheezing.   Cardiovascular:  Negative for chest pain, palpitations, orthopnea and leg swelling.  Gastrointestinal:  Negative for abdominal pain, blood in stool, constipation, diarrhea, heartburn, melena, nausea and vomiting.  Genitourinary:  Negative for dysuria, frequency and urgency.  Musculoskeletal:  Negative for back pain and joint pain.  Skin: Negative.  Negative for itching and rash.  Neurological:  Negative for dizziness, tingling, focal weakness, weakness and headaches.  Endo/Heme/Allergies:  Does not bruise/bleed easily.  Psychiatric/Behavioral:  Negative for depression. The patient is not nervous/anxious and does not have insomnia.      MEDICAL HISTORY:  Past Medical History:  Diagnosis Date   Anxiety    GERD (gastroesophageal reflux disease)    Hypertension    Joint pain    Vitamin D deficiency     SURGICAL HISTORY: Past Surgical History:  Procedure Laterality Date   GALLBLADDER SURGERY      SOCIAL HISTORY: Social History   Socioeconomic History   Marital status: Married    Spouse name: Not on file   Number of children: Not on file   Years of education: Not on file   Highest education level: Not on file  Occupational History  Not on file  Tobacco Use   Smoking status: Never   Smokeless tobacco: Never  Vaping Use   Vaping status: Never Used  Substance and Sexual Activity   Alcohol use: Yes   Drug use: Never   Sexual activity: Not on file  Other Topics Concern   Not on file  Social History Narrative   Not on file   Social  Drivers of Health   Financial Resource Strain: Low Risk  (08/22/2023)   Received from [facility]   Overall Financial Resource Strain (CARDIA)    Difficulty of Paying Living Expenses: Not hard at all  Food Insecurity: No Food Insecurity (08/22/2023)   Received from [facility]   Hunger Vital Sign    Worried About Running Out of Food in the Last Year: Never true    Ran Out of Food in the Last Year: Never true  Transportation Needs: No Transportation Needs (08/22/2023)   Received from [facility] - Transportation    In the past 12 months, has lack of transportation kept you from medical appointments or from getting medications?: No    Lack of Transportation (Non-Medical): No  Physical Activity: Not on file  Stress: Not on file  Social Connections: Not on file  Intimate Partner Violence: Not on file    FAMILY HISTORY: Family History  Problem Relation Age of Onset   Colon cancer Brother    Colon cancer Maternal Aunt    Breast cancer Maternal Aunt    Colon cancer Paternal Aunt    Diabetes Maternal Grandmother    Colon cancer Paternal Grandmother     ALLERGIES:  is allergic to codeine and codone [hydrocodone].  MEDICATIONS:  Current Outpatient Medications  Medication Sig Dispense Refill   ACAI BERRY PO Take by mouth.     aspirin EC 81 MG tablet Take 81 mg by mouth daily.     b complex vitamins capsule Take 1 capsule by mouth daily.     Calcium-Magnesium -Zinc (CAL-MAG-ZINC PO) Take 1 tablet by mouth daily.      Cholecalciferol (VITAMIN D3) 125 MCG (5000 UT) CAPS Take by mouth.     esomeprazole  (NEXIUM ) 40 MG capsule Take 40 mg by mouth daily at 12 noon.      famotidine  (PEPCID ) 40 MG tablet Take 40 mg by mouth daily.      losartan  (COZAAR ) 50 MG tablet Take 1 tablet by mouth daily.     meclizine (ANTIVERT) 25 MG tablet Take 25 mg by mouth.     mometasone  (ELOCON ) 0.1 % cream Apply topically to aa of back for rash qd  5 x weekly as needed. 45 g 0   RYALTRIS 665-25 MCG/ACT SUSP SMARTSIG:1 Spray(s) Both Nares Every 12 Hours PRN     sertraline  (ZOLOFT ) 100 MG tablet Take 100 mg by mouth daily.      Ascorbic Acid (VITAMIN C) 100 MG tablet Take 1,000 mg by mouth daily. (Patient not taking: Reported on 09/14/2022)     hydroxyurea  (HYDREA ) 500 MG capsule Take 1 capsule (500 mg total) by mouth 2 (two) times daily. May take with food to minimize GI side effects. 60 capsule 11   predniSONE (STERAPRED UNI-PAK 21 TAB) 5 MG (21) TBPK tablet Take 5 mg by mouth.     No current facility-administered medications for this visit.     PHYSICAL EXAMINATION:  Vitals:   09/19/23 1424  BP: 115/73  Pulse: 85  Resp: 18  Temp:  98.4 F (36.9 C)  SpO2: 96%   Filed Weights   09/19/23 1424  Weight: 193 lb 1.6 oz (87.6 kg)    Physical Exam Vitals and nursing note reviewed.  HENT:     Head: Normocephalic and atraumatic.     Mouth/Throat:     Pharynx: Oropharynx is clear.  Eyes:     Extraocular Movements: Extraocular movements intact.     Pupils: Pupils are equal, round, and reactive to light.  Cardiovascular:     Rate and Rhythm: Normal rate and regular rhythm.  Abdominal:     Palpations: Abdomen is soft.  Musculoskeletal:        General: Normal range of motion.     Cervical back: Normal range of motion.  Skin:    General: Skin is warm.  Neurological:     General: No focal deficit present.     Mental Status: She is alert and oriented to person, place, and time.  Psychiatric:        Behavior: Behavior normal.        Judgment: Judgment normal.      LABORATORY DATA:  I have reviewed the data as listed Lab Results  Component Value Date   WBC 7.4 09/19/2023   HGB 13.7 09/19/2023   HCT 39.0 09/19/2023   MCV 106.8 (H) 09/19/2023   PLT 287 09/19/2023   Recent Labs    03/21/23 1442 09/19/23 1418  NA 134* 133*  K 4.0 4.2  CL 102 97*  CO2 24 26  GLUCOSE 100* 96  BUN 18 21  CREATININE 1.14* 1.06*   CALCIUM 9.2 9.3  GFRNONAA 51* 56*  PROT 7.3 7.3  ALBUMIN 4.1 4.1  AST 18 27  ALT 14 25  ALKPHOS 65 75  BILITOT 0.6 0.8    RADIOGRAPHIC STUDIES: I have personally reviewed the radiological images as listed and agreed with the findings in the report. No results found.  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  On hydrea  500 mg/day BID+ asprin 81. Requests hydroxyurea  refills for 1 month at a time. Continue baby aspirin once a day- stable.   #Macrocytosis-secondary to Hydrea - stable.   # Slightly elevated Creatinine - 56-63 [PCP]- recommend increased fluids-  # DISPOSITION: # follow up in 6 months- MD; labs- cbc/cmp/LDH-Dr.B  All questions were answered. The patient knows to call the clinic with any problems, questions or concerns.    Gwyn Leos, MD 09/19/2023 3:25 PM

## 2023-09-19 NOTE — Assessment & Plan Note (Addendum)
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  On hydrea  500 mg/day BID+ asprin 81. Requests hydroxyurea  refills for 1 month at a time. Continue baby aspirin once a day- stable.   #Macrocytosis-secondary to Hydrea - stable.   # Slightly elevated Creatinine - 56-63 [PCP]- recommend increased fluids-  # DISPOSITION: # follow up in 6 months- MD; labs- cbc/cmp/LDH-Dr.B

## 2023-09-19 NOTE — Progress Notes (Signed)
 Fatigue: NO  Headaches: YES-SEES DR. Donnie Galea Joint pain: NO Bleeding from gums/nose:  NO   Pt had a mammogram in 2/25.

## 2024-01-27 ENCOUNTER — Other Ambulatory Visit: Payer: Self-pay

## 2024-01-27 MED ORDER — FLUZONE HIGH-DOSE 0.5 ML IM SUSY
0.5000 mL | PREFILLED_SYRINGE | Freq: Once | INTRAMUSCULAR | 0 refills | Status: AC
  Filled 2024-01-27: qty 0.5, 1d supply, fill #0

## 2024-01-30 ENCOUNTER — Other Ambulatory Visit: Payer: Self-pay

## 2024-01-31 ENCOUNTER — Other Ambulatory Visit: Payer: Self-pay

## 2024-02-03 ENCOUNTER — Other Ambulatory Visit: Payer: Self-pay

## 2024-02-21 ENCOUNTER — Other Ambulatory Visit: Payer: Self-pay

## 2024-02-29 ENCOUNTER — Other Ambulatory Visit: Payer: Self-pay

## 2024-02-29 MED ORDER — COMIRNATY 30 MCG/0.3ML IM SUSY
0.3000 mL | PREFILLED_SYRINGE | Freq: Once | INTRAMUSCULAR | 0 refills | Status: AC
Start: 2024-02-29 — End: 2024-03-01
  Filled 2024-02-29: qty 0.3, 1d supply, fill #0

## 2024-03-20 ENCOUNTER — Other Ambulatory Visit

## 2024-03-20 ENCOUNTER — Ambulatory Visit: Admitting: Internal Medicine

## 2024-03-22 ENCOUNTER — Inpatient Hospital Stay: Attending: Internal Medicine

## 2024-03-22 ENCOUNTER — Encounter: Payer: Self-pay | Admitting: Nurse Practitioner

## 2024-03-22 ENCOUNTER — Inpatient Hospital Stay: Admitting: Nurse Practitioner

## 2024-03-22 VITALS — BP 115/75 | HR 88 | Temp 98.4°F | Resp 18 | Ht 69.0 in | Wt 209.4 lb

## 2024-03-22 DIAGNOSIS — Z79899 Other long term (current) drug therapy: Secondary | ICD-10-CM

## 2024-03-22 DIAGNOSIS — D473 Essential (hemorrhagic) thrombocythemia: Secondary | ICD-10-CM

## 2024-03-22 LAB — CBC WITH DIFFERENTIAL (CANCER CENTER ONLY)
Abs Immature Granulocytes: 0.01 K/uL (ref 0.00–0.07)
Basophils Absolute: 0.1 K/uL (ref 0.0–0.1)
Basophils Relative: 1 %
Eosinophils Absolute: 0.1 K/uL (ref 0.0–0.5)
Eosinophils Relative: 1 %
HCT: 38.4 % (ref 36.0–46.0)
Hemoglobin: 13.6 g/dL (ref 12.0–15.0)
Immature Granulocytes: 0 %
Lymphocytes Relative: 37 %
Lymphs Abs: 2.6 K/uL (ref 0.7–4.0)
MCH: 38 pg — ABNORMAL HIGH (ref 26.0–34.0)
MCHC: 35.4 g/dL (ref 30.0–36.0)
MCV: 107.3 fL — ABNORMAL HIGH (ref 80.0–100.0)
Monocytes Absolute: 0.7 K/uL (ref 0.1–1.0)
Monocytes Relative: 10 %
Neutro Abs: 3.7 K/uL (ref 1.7–7.7)
Neutrophils Relative %: 51 %
Platelet Count: 303 K/uL (ref 150–400)
RBC: 3.58 MIL/uL — ABNORMAL LOW (ref 3.87–5.11)
RDW: 11.7 % (ref 11.5–15.5)
WBC Count: 7.2 K/uL (ref 4.0–10.5)
nRBC: 0 % (ref 0.0–0.2)

## 2024-03-22 LAB — CMP (CANCER CENTER ONLY)
ALT: 16 U/L (ref 0–44)
AST: 21 U/L (ref 15–41)
Albumin: 4.1 g/dL (ref 3.5–5.0)
Alkaline Phosphatase: 65 U/L (ref 38–126)
Anion gap: 10 (ref 5–15)
BUN: 19 mg/dL (ref 8–23)
CO2: 23 mmol/L (ref 22–32)
Calcium: 9.4 mg/dL (ref 8.9–10.3)
Chloride: 98 mmol/L (ref 98–111)
Creatinine: 1.01 mg/dL — ABNORMAL HIGH (ref 0.44–1.00)
GFR, Estimated: 59 mL/min — ABNORMAL LOW (ref >60)
Glucose, Bld: 100 mg/dL — ABNORMAL HIGH (ref 70–99)
Potassium: 3.7 mmol/L (ref 3.5–5.1)
Sodium: 131 mmol/L — ABNORMAL LOW (ref 135–145)
Total Bilirubin: 0.8 mg/dL (ref 0.0–1.2)
Total Protein: 7.7 g/dL (ref 6.5–8.1)

## 2024-03-22 LAB — LACTATE DEHYDROGENASE: LDH: 164 U/L (ref 98–192)

## 2024-03-22 NOTE — Progress Notes (Signed)
 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  Platelet count has ranged between 457,000 - 590,000 for the past 4 years.   CBC on 05/05/2017 revealed a hematocrit of 38.5, hemoglobin 13, MCV 86.7, platelets 590,000, white count 10,800 and ANC of 7600.  Differential was unremarkable.   Work-up on 11/04/2017 revealed a hematocrit of 41.5, hemoglobin 14.2, MCV 87, platelets 573,000, WBC 9800 with an ANC of 6500.  Differential was normal.  Ferritin was 31.  Iron studies included a saturation of 25% and TIBC of 293.  Sed rate was 11.  BCR-ABL was negative.  JAK2 V617F was positive.  von Willebrand panel was normal on 12/13/2017.   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   Reticulin and trichrome stains did not show significant increased in reticulin  fibers or collagen deposition.  Cytogenetics were nDose was increasormal (46, XX).    She began hydroxyurea  on 12/13/2017.  She is on hydroxyurea  500 mg BID.  She is on a baby aspirin.   EGD on 07/25/2017 for epigastric pain and dysphasia revealed reflux esophagitis, a single gastric polyp (fundic gland polyp) normal duodenum.  H. pylori and Barrett's were negative.  Colonoscopy on 09/22/2017 revealed one small polyp in the mid transverse colon (tubular adenoma) and diverticulosis in the sigmoid colon.    Essential thrombocytosis (HCC)  11/04/2017 Initial Diagnosis   Essential thrombocytosis (HCC)    HISTORY OF PRESENTING ILLNESS: Alone.   Emily Wallace 73 y.o. female with history of essential thrombocytosis on Hydrea   is here for follow-up.  Patient continues to be compliant with her Hydrea .  Denies any nausea vomiting or diarrhea.  No new  thromboembolic events.   Review of Systems  Constitutional:  Negative for chills, diaphoresis, fever, malaise/fatigue and weight loss.  HENT:  Negative for nosebleeds and sore throat.   Eyes:  Negative for double vision.  Respiratory:  Negative for cough, hemoptysis, sputum production, shortness of breath and wheezing.   Cardiovascular:  Negative for chest pain, palpitations, orthopnea and leg swelling.  Gastrointestinal:  Negative for abdominal pain, blood in stool, constipation, diarrhea, heartburn, melena, nausea and vomiting.  Genitourinary:  Negative for dysuria, frequency and urgency.  Musculoskeletal:  Negative for back pain and joint pain.  Skin: Negative.  Negative for itching and rash.  Neurological:  Negative for dizziness, tingling, focal weakness, weakness and headaches.  Endo/Heme/Allergies:  Does not bruise/bleed easily.  Psychiatric/Behavioral:  Negative for depression. The patient is not nervous/anxious and does not have insomnia.     MEDICAL HISTORY:  Past Medical History:  Diagnosis Date   Anxiety    GERD (gastroesophageal reflux disease)    Hypertension    Joint pain    Vitamin D deficiency    SURGICAL HISTORY: Past Surgical History:  Procedure Laterality Date   GALLBLADDER SURGERY     SOCIAL HISTORY: Social History   Socioeconomic History   Marital status: Married    Spouse name: Not on file   Number of children: Not on file   Years of education: Not on file   Highest education level: Not on file  Occupational History   Not  on file  Tobacco Use   Smoking status: Never   Smokeless tobacco: Never  Vaping Use   Vaping status: Never Used  Substance and Sexual Activity   Alcohol use: Yes   Drug use: Never   Sexual activity: Not on file  Other Topics Concern   Not on file  Social History Narrative   Not on file   Social  Drivers of Health   Financial Resource Strain: Low Risk  (08/22/2023)   Received from [facility]   Overall Financial Resource Strain (CARDIA)    Difficulty of Paying Living Expenses: Not hard at all  Food Insecurity: No Food Insecurity (08/22/2023)   Received from [facility]   Hunger Vital Sign    Within the past 12 months, you worried that your food would run out before you got the money to buy more.: Never true    Within the past 12 months, the food you bought just didn't last and you didn't have money to get more.: Never true  Transportation Needs: No Transportation Needs (08/22/2023)   Received from [facility] - Transportation    In the past 12 months, has lack of transportation kept you from medical appointments or from getting medications?: No    Lack of Transportation (Non-Medical): No  Physical Activity: Not on file  Stress: Not on file  Social Connections: Not on file  Intimate Partner Violence: Not on file   FAMILY HISTORY: Family History  Problem Relation Age of Onset   Colon cancer Brother    Colon cancer Maternal Aunt    Breast cancer Maternal Aunt    Colon cancer Paternal Aunt    Diabetes Maternal Grandmother    Colon cancer Paternal Grandmother    ALLERGIES:  is allergic to codeine and codone [hydrocodone].  MEDICATIONS:  Current Outpatient Medications  Medication Sig Dispense Refill   ACAI BERRY PO Take by mouth.     aspirin EC 81 MG tablet Take 81 mg by mouth daily.     b complex vitamins capsule Take 1 capsule by mouth daily.     Calcium-Magnesium-Zinc (CAL-MAG-ZINC PO) Take 1 tablet by mouth daily.      Cholecalciferol (VITAMIN D3) 125 MCG (5000 UT) CAPS Take by mouth.     esomeprazole (NEXIUM) 40 MG capsule Take 40 mg by mouth daily at 12 noon.      famotidine (PEPCID) 40 MG tablet Take 40 mg by mouth daily.      hydrochlorothiazide (HYDRODIURIL) 25 MG tablet Take 25 mg by mouth daily.      hydroxyurea  (HYDREA ) 500 MG capsule Take 1 capsule (500 mg total) by mouth 2 (two) times daily. May take with food to minimize GI side effects. 60 capsule 11   losartan (COZAAR) 50 MG tablet Take 1 tablet by mouth daily.     meclizine (ANTIVERT) 25 MG tablet Take 25 mg by mouth.     mometasone  (ELOCON ) 0.1 % cream Apply topically to aa of back for rash qd 5 x weekly as needed. 45 g 0   RYALTRIS 665-25 MCG/ACT SUSP SMARTSIG:1 Spray(s) Both Nares Every 12 Hours PRN     sertraline (ZOLOFT) 100 MG tablet Take 100 mg by mouth daily.      Ascorbic Acid (VITAMIN C) 100 MG tablet Take 1,000 mg by mouth daily. (Patient not taking: Reported on 03/22/2024)     rosuvastatin (CRESTOR) 5 MG tablet Take 5 mg by mouth daily.  No current facility-administered medications for this visit.   PHYSICAL EXAMINATION: Vitals:   03/22/24 1047  BP: 115/75  Pulse: 88  Resp: 18  Temp: 98.4 F (36.9 C)  SpO2: 97%   Filed Weights   03/22/24 1047  Weight: 209 lb 6.4 oz (95 kg)    Physical Exam Vitals reviewed.  Constitutional:      Appearance: She is not ill-appearing.  HENT:     Head: Normocephalic and atraumatic.     Mouth/Throat:     Pharynx: Oropharynx is clear.  Cardiovascular:     Rate and Rhythm: Normal rate and regular rhythm.  Pulmonary:     Effort: No respiratory distress.  Abdominal:     Palpations: Abdomen is soft.  Musculoskeletal:        General: Normal range of motion.  Skin:    General: Skin is warm.     Coloration: Skin is not pale.     Findings: No bruising.  Neurological:     General: No focal deficit present.     Mental Status: She is alert and oriented to person, place, and time.  Psychiatric:        Mood and Affect: Mood normal.        Behavior: Behavior normal.      LABORATORY DATA:  I have reviewed the data as listed Lab Results  Component Value Date   WBC 7.2 03/22/2024   HGB 13.6 03/22/2024   HCT 38.4 03/22/2024   MCV 107.3 (H) 03/22/2024   PLT 303  03/22/2024   Recent Labs    09/19/23 1418 03/22/24 1053  NA 133* 131*  K 4.2 3.7  CL 97* 98  CO2 26 23  GLUCOSE 96 100*  BUN 21 19  CREATININE 1.06* 1.01*  CALCIUM 9.3 9.4  GFRNONAA 56* 59*  PROT 7.3 7.7  ALBUMIN 4.1 4.1  AST 27 21  ALT 25 16  ALKPHOS 75 65  BILITOT 0.8 0.8   RADIOGRAPHIC STUDIES: I have personally reviewed the radiological images as listed and agreed with the findings in the report. No results found.  Assessment & Plan:   #  Essential thrombocytosis - JAK2 (+) MPN (Essential thrombocythemia) on hydrea .  On hydrea  500 mg/day BID+ asprin 81. Tolerating well. Plt 303. No vte. Within goal. Continue. Requests hydroxyurea  refills for 1 month at a time.    # Macrocytosis-secondary to Hydrea - stable.    # Slightly elevated Creatinine - 56-63 [PCP]- recommend increased fluids-   # DISPOSITION: # 6 months- labs- cbc/cmp/LDH, Dr Rennie- la  No problem-specific Assessment & Plan notes found for this encounter.  All questions were answered. The patient knows to call the clinic with any problems, questions or concerns.    Tinnie KANDICE Dawn, NP 03/22/2024

## 2024-03-22 NOTE — Progress Notes (Signed)
 No concerns today

## 2024-04-02 ENCOUNTER — Ambulatory Visit: Admitting: Dermatology

## 2024-04-02 DIAGNOSIS — D1801 Hemangioma of skin and subcutaneous tissue: Secondary | ICD-10-CM

## 2024-04-02 DIAGNOSIS — W908XXA Exposure to other nonionizing radiation, initial encounter: Secondary | ICD-10-CM | POA: Diagnosis not present

## 2024-04-02 DIAGNOSIS — L719 Rosacea, unspecified: Secondary | ICD-10-CM | POA: Diagnosis not present

## 2024-04-02 DIAGNOSIS — D229 Melanocytic nevi, unspecified: Secondary | ICD-10-CM

## 2024-04-02 DIAGNOSIS — L570 Actinic keratosis: Secondary | ICD-10-CM | POA: Diagnosis not present

## 2024-04-02 DIAGNOSIS — L820 Inflamed seborrheic keratosis: Secondary | ICD-10-CM

## 2024-04-02 DIAGNOSIS — L821 Other seborrheic keratosis: Secondary | ICD-10-CM

## 2024-04-02 DIAGNOSIS — Z1283 Encounter for screening for malignant neoplasm of skin: Secondary | ICD-10-CM

## 2024-04-02 DIAGNOSIS — L578 Other skin changes due to chronic exposure to nonionizing radiation: Secondary | ICD-10-CM | POA: Diagnosis not present

## 2024-04-02 DIAGNOSIS — Z7189 Other specified counseling: Secondary | ICD-10-CM

## 2024-04-02 DIAGNOSIS — Z79899 Other long term (current) drug therapy: Secondary | ICD-10-CM

## 2024-04-02 DIAGNOSIS — L814 Other melanin hyperpigmentation: Secondary | ICD-10-CM

## 2024-04-02 MED ORDER — METRONIDAZOLE 0.75 % EX CREA
TOPICAL_CREAM | CUTANEOUS | 6 refills | Status: AC
Start: 2024-04-02 — End: ?

## 2024-04-02 NOTE — Patient Instructions (Addendum)

## 2024-04-02 NOTE — Progress Notes (Unsigned)
 Follow-Up Visit   Subjective  Emily Wallace is a 73 y.o. female who presents for the following: Skin Cancer Screening and Full Body Skin Exam, hx of precancers  The patient presents for Total-Body Skin Exam (TBSE) for skin cancer screening and mole check. The patient has spots, moles and lesions to be evaluated, some may be new or changing and the patient may have concern these could be cancer.  The following portions of the chart were reviewed this encounter and updated as appropriate: medications, allergies, medical history  Review of Systems:  No other skin or systemic complaints except as noted in HPI or Assessment and Plan.  Objective  Well appearing patient in no apparent distress; mood and affect are within normal limits.  A full examination was performed including scalp, head, eyes, ears, nose, lips, neck, chest, axillae, abdomen, back, buttocks, bilateral upper extremities, bilateral lower extremities, hands, feet, fingers, toes, fingernails, and toenails. All findings within normal limits unless otherwise noted below.   Relevant physical exam findings are noted in the Assessment and Plan.  left cheek x 1, right chest x 1, left chest x 1 (3) Erythematous thin papules/macules with gritty scale.  right upper back x 1, right post thigh x 1 (2) Stuck-on, waxy, tan-brown papules and plaques -- Discussed benign etiology and prognosis.   Assessment & Plan   SKIN CANCER SCREENING PERFORMED TODAY.  ACTINIC DAMAGE - Chronic condition, secondary to cumulative UV/sun exposure - diffuse scaly erythematous macules with underlying dyspigmentation - Recommend daily broad spectrum sunscreen SPF 30+ to sun-exposed areas, reapply every 2 hours as needed.  - Staying in the shade or wearing long sleeves, sun glasses (UVA+UVB protection) and wide brim hats (4-inch brim around the entire circumference of the hat) are also recommended for sun protection.  - Call for new or changing  lesions.  LENTIGINES, SEBORRHEIC KERATOSES, HEMANGIOMAS - Benign normal skin lesions - Benign-appearing - Call for any changes  MELANOCYTIC NEVI - Tan-brown and/or pink-flesh-colored symmetric macules and papules - Benign appearing on exam today - Observation - Call clinic for new or changing moles - Recommend daily use of broad spectrum spf 30+ sunscreen to sun-exposed areas.   ROSACEA Exam Mid face erythema with telangiectasias +/- scattered inflammatory papules on her nose  Chronic and persistent condition with duration or expected duration over one year. Condition is symptomatic/ bothersome to patient. Not currently at goal.  Rosacea is a chronic progressive skin condition usually affecting the face of adults, causing redness and/or acne bumps. It is treatable but not curable. It sometimes affects the eyes (ocular rosacea) as well. It may respond to topical and/or systemic medication and can flare with stress, sun exposure, alcohol, exercise, topical steroids (including hydrocortisone/cortisone 10) and some foods.  Daily application of broad spectrum spf 30+ sunscreen to face is recommended to reduce flares. Treatment Plan Start Metronidazole cream apply to face qd-bid     AK (ACTINIC KERATOSIS) (3) left cheek x 1, right chest x 1, left chest x 1 (3) ACTINIC DAMAGE - chronic, secondary to cumulative UV radiation exposure/sun exposure over time - diffuse scaly erythematous macules with underlying dyspigmentation - Recommend daily broad spectrum sunscreen SPF 30+ to sun-exposed areas, reapply every 2 hours as needed.  - Recommend staying in the shade or wearing long sleeves, sun glasses (UVA+UVB protection) and wide brim hats (4-inch brim around the entire circumference of the hat). - Call for new or changing lesions.   Recheck areas treated on the chest in  6 months  Destruction of lesion - left cheek x 1, right chest x 1, left chest x 1 (3) Complexity: simple   Destruction  method: cryotherapy   Informed consent: discussed and consent obtained   Timeout:  patient name, date of birth, surgical site, and procedure verified Lesion destroyed using liquid nitrogen: Yes   Region frozen until ice ball extended beyond lesion: Yes   Outcome: patient tolerated procedure well with no complications   Post-procedure details: wound care instructions given    INFLAMED SEBORRHEIC KERATOSIS (2) right upper back x 1, right post thigh x 1 (2) Destruction of lesion - right upper back x 1, right post thigh x 1 (2) Complexity: simple   Destruction method: cryotherapy   Informed consent: discussed and consent obtained   Timeout:  patient name, date of birth, surgical site, and procedure verified Lesion destroyed using liquid nitrogen: Yes   Region frozen until ice ball extended beyond lesion: Yes   Outcome: patient tolerated procedure well with no complications   Post-procedure details: wound care instructions given      Return in about 6 months (around 09/30/2024) for Aks.  IFay Kirks, CMA, am acting as scribe for Alm Rhyme, MD .   Documentation: I have reviewed the above documentation for accuracy and completeness, and I agree with the above.  Alm Rhyme, MD

## 2024-04-03 ENCOUNTER — Encounter: Payer: Self-pay | Admitting: Dermatology

## 2024-04-05 ENCOUNTER — Ambulatory Visit: Payer: Medicare PPO | Admitting: Dermatology

## 2024-05-04 ENCOUNTER — Other Ambulatory Visit: Payer: Self-pay

## 2024-09-18 ENCOUNTER — Inpatient Hospital Stay

## 2024-09-18 ENCOUNTER — Inpatient Hospital Stay: Admitting: Internal Medicine

## 2024-10-01 ENCOUNTER — Ambulatory Visit: Admitting: Dermatology
